# Patient Record
Sex: Male | Born: 1960 | Race: White | Hispanic: No | Marital: Married | Smoking: Former smoker
Health system: Southern US, Community
[De-identification: ages and names within clinical notes are randomized; demographics above are authoritative.]

## PROBLEM LIST (undated history)

## (undated) DIAGNOSIS — G43909 Migraine, unspecified, not intractable, without status migrainosus: Secondary | ICD-10-CM

## (undated) DIAGNOSIS — M25512 Pain in left shoulder: Secondary | ICD-10-CM

## (undated) DIAGNOSIS — I8390 Asymptomatic varicose veins of unspecified lower extremity: Secondary | ICD-10-CM

## (undated) HISTORY — DX: Migraine, unspecified, not intractable, without status migrainosus: G43.909

## (undated) HISTORY — PX: VASECTOMY: SHX75

## (undated) HISTORY — PX: WISDOM TOOTH EXTRACTION: SHX21

## (undated) HISTORY — DX: Asymptomatic varicose veins of unspecified lower extremity: I83.90

## (undated) HISTORY — DX: Pain in left shoulder: M25.512

## (undated) HISTORY — PX: ROTATOR CUFF REPAIR: SHX139

---

## 1998-05-31 ENCOUNTER — Encounter: Admission: RE | Admit: 1998-05-31 | Discharge: 1998-05-31 | Payer: Self-pay | Admitting: *Deleted

## 1998-07-12 ENCOUNTER — Ambulatory Visit (HOSPITAL_COMMUNITY): Admission: RE | Admit: 1998-07-12 | Discharge: 1998-07-12 | Payer: Self-pay | Admitting: Urology

## 1998-07-12 ENCOUNTER — Other Ambulatory Visit: Admission: RE | Admit: 1998-07-12 | Discharge: 1998-07-12 | Payer: Self-pay | Admitting: Urology

## 1998-10-05 ENCOUNTER — Emergency Department (HOSPITAL_COMMUNITY): Admission: EM | Admit: 1998-10-05 | Discharge: 1998-10-05 | Payer: Self-pay | Admitting: Emergency Medicine

## 2001-11-19 ENCOUNTER — Encounter: Payer: Self-pay | Admitting: Internal Medicine

## 2001-11-19 ENCOUNTER — Encounter: Admission: RE | Admit: 2001-11-19 | Discharge: 2001-11-19 | Payer: Self-pay | Admitting: Internal Medicine

## 2005-08-28 ENCOUNTER — Ambulatory Visit: Payer: Self-pay | Admitting: Internal Medicine

## 2005-09-18 ENCOUNTER — Ambulatory Visit: Payer: Self-pay | Admitting: Internal Medicine

## 2005-10-04 ENCOUNTER — Emergency Department (HOSPITAL_COMMUNITY): Admission: EM | Admit: 2005-10-04 | Discharge: 2005-10-04 | Payer: Self-pay | Admitting: Emergency Medicine

## 2005-10-18 ENCOUNTER — Ambulatory Visit: Payer: Self-pay | Admitting: Internal Medicine

## 2006-01-21 ENCOUNTER — Ambulatory Visit: Payer: Self-pay | Admitting: Internal Medicine

## 2007-01-16 ENCOUNTER — Encounter: Payer: Self-pay | Admitting: Internal Medicine

## 2007-01-23 ENCOUNTER — Encounter: Admission: RE | Admit: 2007-01-23 | Discharge: 2007-04-23 | Payer: Self-pay | Admitting: Specialist

## 2011-01-23 ENCOUNTER — Encounter: Payer: Self-pay | Admitting: Internal Medicine

## 2011-01-23 ENCOUNTER — Encounter (INDEPENDENT_AMBULATORY_CARE_PROVIDER_SITE_OTHER): Payer: BC Managed Care – PPO | Admitting: Internal Medicine

## 2011-01-23 ENCOUNTER — Encounter (INDEPENDENT_AMBULATORY_CARE_PROVIDER_SITE_OTHER): Payer: Self-pay | Admitting: *Deleted

## 2011-01-23 DIAGNOSIS — Z0000 Encounter for general adult medical examination without abnormal findings: Secondary | ICD-10-CM

## 2011-01-23 DIAGNOSIS — R51 Headache: Secondary | ICD-10-CM

## 2011-01-23 DIAGNOSIS — R519 Headache, unspecified: Secondary | ICD-10-CM | POA: Insufficient documentation

## 2011-01-23 DIAGNOSIS — Z1211 Encounter for screening for malignant neoplasm of colon: Secondary | ICD-10-CM

## 2011-01-23 DIAGNOSIS — Z23 Encounter for immunization: Secondary | ICD-10-CM

## 2011-01-30 ENCOUNTER — Other Ambulatory Visit (INDEPENDENT_AMBULATORY_CARE_PROVIDER_SITE_OTHER): Payer: BC Managed Care – PPO

## 2011-01-30 DIAGNOSIS — I10 Essential (primary) hypertension: Secondary | ICD-10-CM

## 2011-01-30 DIAGNOSIS — R51 Headache: Secondary | ICD-10-CM

## 2011-01-30 DIAGNOSIS — Z0000 Encounter for general adult medical examination without abnormal findings: Secondary | ICD-10-CM

## 2011-01-30 DIAGNOSIS — Z1211 Encounter for screening for malignant neoplasm of colon: Secondary | ICD-10-CM

## 2011-01-30 LAB — PSA: PSA: 0.32 ng/mL (ref 0.10–4.00)

## 2011-01-30 LAB — BASIC METABOLIC PANEL
CO2: 29 mEq/L (ref 19–32)
Calcium: 9.1 mg/dL (ref 8.4–10.5)
Creatinine, Ser: 1 mg/dL (ref 0.4–1.5)

## 2011-01-30 LAB — CBC WITH DIFFERENTIAL/PLATELET
Basophils Absolute: 0 10*3/uL (ref 0.0–0.1)
Basophils Relative: 0.4 % (ref 0.0–3.0)
Eosinophils Absolute: 0.5 10*3/uL (ref 0.0–0.7)
Lymphocytes Relative: 23 % (ref 12.0–46.0)
MCHC: 34.8 g/dL (ref 30.0–36.0)
Neutrophils Relative %: 62.8 % (ref 43.0–77.0)
Platelets: 228 10*3/uL (ref 150.0–400.0)
RBC: 4.88 Mil/uL (ref 4.22–5.81)

## 2011-01-30 LAB — HEPATIC FUNCTION PANEL
ALT: 24 U/L (ref 0–53)
AST: 22 U/L (ref 0–37)
Albumin: 4.1 g/dL (ref 3.5–5.2)
Alkaline Phosphatase: 62 U/L (ref 39–117)
Bilirubin, Direct: 0.1 mg/dL (ref 0.0–0.3)
Total Bilirubin: 0.6 mg/dL (ref 0.3–1.2)
Total Protein: 6.4 g/dL (ref 6.0–8.3)

## 2011-01-30 LAB — TSH: TSH: 1.06 u[IU]/mL (ref 0.35–5.50)

## 2011-01-30 LAB — LIPID PANEL
Cholesterol: 124 mg/dL (ref 0–200)
HDL: 39.8 mg/dL (ref >39.00)
LDL Cholesterol: 72 mg/dL (ref 0–99)
Total CHOL/HDL Ratio: 3
Triglycerides: 59 mg/dL (ref 0.0–149.0)
VLDL: 11.8 mg/dL (ref 0.0–40.0)

## 2011-01-30 NOTE — Progress Notes (Signed)
Addended by: Floydene Flock on: 01/30/2011 12:31 PM   Modules accepted: Orders

## 2011-01-30 NOTE — Letter (Signed)
Summary: Pre Visit Letter Revised  [facility]  [address] [place], [place] [address]   Phone: [phone]  Fax: [phone]        01/23/2011 MRN: [id number] [facility] [address] [place], [place]  [address]  [place]             Procedure Date:  02-28-11           Direct Colon--Dr. [name]   Welcome to the Gastroenterology Division at [facility].    You are scheduled to see a nurse for your pre-procedure visit on 02-14-11 at 8:30a.m. on the 3rd floor at [facility], [address]  We ask that you try to arrive at our office 15 minutes prior to your appointment time to allow for check-in.  Please take a minute to review the attached form.  If you answer "Yes" to one or more of the questions on the first page, we ask that you call the person listed at your earliest opportunity.  If you answer "No" to all of the questions, please complete the rest of the form and bring it to your appointment.    Your nurse visit will consist of discussing your medical and surgical history, your immediate family medical history, and your medications.   If you are unable to list all of your medications on the form, please bring the medication bottles to your appointment and we will list them.  We will need to be aware of both prescribed and over the counter drugs.  We will need to know exact dosage information as well.    Please be prepared to read and sign documents such as consent forms, a financial agreement, and acknowledgement forms.  If necessary, and with your consent, a friend or relative is welcome to sit-in on the nurse visit with you.  Please bring your insurance card so that we may make a copy of it.  If your insurance requires a referral to see a specialist, please bring your referral form from your primary care physician.  No co-pay is required for this nurse visit.     If you cannot keep your appointment, please call [phone] to cancel or  reschedule prior to your appointment date.  This allows [place] the opportunity to schedule an appointment for another patient in need of care.    Thank you for choosing [facility] for your medical needs.  We appreciate the opportunity to care for you.  Please visit [place] at our website  to learn more about our practice.  Sincerely, The Gastroenterology Division

## 2011-01-30 NOTE — Assessment & Plan Note (Signed)
Summary: CPX/KN/PH   Vital Signs:  Patient profile:   50 year old male Height:      72.50 inches Weight:      250 pounds BMI:     33.56 Pulse rate:   68 / minute Resp:     13 per minute BP sitting:   120 / 70  (left arm)  Vitals Entered By: Doristine Devoid CMA (January 23, 2011 12:47 PM)  CC: CPX not fasting , General Medical Evaluation   CC:  CPX not fasting  and General Medical Evaluation.  History of Present Illness:    [facility]  is here  for a physical; he is asymptomatic.   Preventive Screening-Counseling & Management  Alcohol-Tobacco     Smoking Status: quit  Caffeine-Diet-Exercise     Does Patient Exercise: yes  Current Medications (verified): 1)  None  Allergies (verified): No Known Drug Allergies  Past History:  Past Medical History: Varicose Veins Headache, migraines Hematuria, negative Urologic  evaluation 2002/04/18, Dr Aldean Ast  Past Surgical History: Rotator cuff repair 1978 Vasectomy  Family History: Father: headaches,HTN, prostate disease , lung cancer, CVA (died 2010-04-18) Mother: depression,HTN Siblings: negative PGM: CVA; MGM : lung cancer   Social History: Occupation:Senior Art gallery manager Married Former Smoker: quit 04/18/86 Alcohol use-yes:rarely Regular exercise-yes; > 2X/week Smoking Status:  quit Does Patient Exercise:  yes  Review of Systems  The patient denies anorexia, fever, weight loss, weight gain, vision loss, decreased hearing, hoarseness, chest pain, syncope, dyspnea on exertion, peripheral edema, prolonged cough, hemoptysis, abdominal pain, melena, hematochezia, severe indigestion/heartburn, hematuria, suspicious skin lesions, depression, unusual weight change, abnormal bleeding, enlarged lymph nodes, and angioedema.    Physical Exam  General:  well-nourished;alert,appropriate and cooperative throughout examination Head:  Normocephalic and atraumatic without obvious abnormalities. Pattern  alopecia  Eyes:  No corneal or  conjunctival inflammation noted. Marland Kitchen Perrla. Funduscopic exam benign, without hemorrhages, exudates or papilledema.  Ears:  External ear exam shows no significant lesions or deformities.  Otoscopic examination reveals clear canals, tympanic membranes are intact bilaterally without bulging, retraction, inflammation or discharge. Hearing is grossly normal bilaterally. Nose:  External nasal examination shows no deformity or inflammation. Nasal mucosa are pink and moist without lesions or exudates. Dislocated & deviated septum Mouth:  Oral mucosa and oropharynx without lesions or exudates.  Teeth in good repair. Neck:  No deformities, masses, or tenderness noted. Lungs:  Normal respiratory effort, chest expands symmetrically. Lungs are clear to auscultation, no crackles or wheezes. Heart:  Normal rate and regular rhythm. S1 and S2 normal without gallop, murmur, click, rub .S4 Abdomen:  Bowel sounds positive,abdomen soft and non-tender without masses, organomegaly or hernias noted. Rectal:  No external abnormalities noted. Normal sphincter tone. No rectal masses or tenderness. Genitalia:  Testes bilaterally descended without nodularity, tenderness or masses. No scrotal masses or lesions. No penis lesions or urethral discharge. L varicocele.   Prostate:  Prostate gland firm and smooth, ULN size w/o  enlargement, nodularity, tenderness, mass, asymmetry or induration. Msk:  No deformity or scoliosis noted of thoracic or lumbar spine.   Pulses:  R and L carotid,radial,dorsalis pedis and posterior tibial pulses are full and equal bilaterally Extremities:  No clubbing, cyanosis, edema, or deformity noted with normal full range of motion of all joints.   Neurologic:  alert & oriented X3 and DTRs symmetrical and normal.   Skin:  Intact without suspicious lesions or rashes Cervical Nodes:  No lymphadenopathy noted Axillary Nodes:  No palpable lymphadenopathy Inguinal Nodes:  No  significant adenopathy Psych:   memory intact for recent and remote, normally interactive, and good eye contact.     Impression & Recommendations:  Problem # 1:  ROUTINE GENERAL MEDICAL EXAM@HEALTH  CARE FACL (ICD-V70.0)  Orders: EKG w/ Interpretation (93000) Gastroenterology Referral (GI)  Problem # 2:  SCREENING, COLON CANCER (ICD-V76.51)  Orders: Gastroenterology Referral (GI)  Problem # 3:  HEADACHE (ICD-784.0) PMH of migraines  Other Orders: Tdap => 37yrs IM (04540) Admin 1st Vaccine (98119)  Patient Instructions: 1)  Consider fasting labs: 2)  BMP; 3)  Hepatic Panel ; 4)  Lipid Panel ; 5)  TSH ; 6)  CBC w/ Diff ; 7)  PSA .   Orders Added: 1)  Tdap => 92yrs IM [90715] 2)  Admin 1st Vaccine [90471] 3)  Est. Patient 40-64 years [99396] 4)  EKG w/ Interpretation [93000] 5)  Gastroenterology Referral [GI]   Immunizations Administered:  Tetanus Vaccine:    Vaccine Type: Tdap    Site: right deltoid    Mfr: GlaxoSmithKline    Dose: 0.5 ml    Route: IM    Given by: Doristine Devoid CMA    Exp. Date: 10/05/2012    Lot #: [id number]   Immunizations Administered:  Tetanus Vaccine:    Vaccine Type: Tdap    Site: right deltoid    Mfr: GlaxoSmithKline    Dose: 0.5 ml    Route: IM    Given by: Doristine Devoid CMA    Exp. Date: 10/05/2012    Lot #: [id number]

## 2011-02-08 NOTE — Letter (Signed)
Summary: [facility]   Imported By: Maryln Gottron 01/29/2011 13:09:26  _____________________________________________________________________  External Attachment:    Type:   Image     Comment:   External Document

## 2011-02-14 ENCOUNTER — Ambulatory Visit (AMBULATORY_SURGERY_CENTER): Payer: BC Managed Care – PPO | Admitting: *Deleted

## 2011-02-14 VITALS — Ht 73.0 in | Wt 251.0 lb

## 2011-02-14 DIAGNOSIS — Z1211 Encounter for screening for malignant neoplasm of colon: Secondary | ICD-10-CM

## 2011-02-14 MED ORDER — PEG-KCL-NACL-NASULF-NA ASC-C 100 G PO SOLR: ORAL | Status: DC

## 2011-02-27 ENCOUNTER — Encounter: Payer: Self-pay | Admitting: Internal Medicine

## 2011-02-28 ENCOUNTER — Encounter: Payer: Self-pay | Admitting: Internal Medicine

## 2011-02-28 ENCOUNTER — Ambulatory Visit (AMBULATORY_SURGERY_CENTER): Payer: BC Managed Care – PPO | Admitting: Internal Medicine

## 2011-02-28 VITALS — BP 124/57 | HR 64 | Temp 97.1°F | Resp 17 | Ht 73.0 in | Wt 245.0 lb

## 2011-02-28 DIAGNOSIS — K5730 Diverticulosis of large intestine without perforation or abscess without bleeding: Secondary | ICD-10-CM

## 2011-02-28 DIAGNOSIS — Z1211 Encounter for screening for malignant neoplasm of colon: Secondary | ICD-10-CM

## 2011-02-28 MED ORDER — SODIUM CHLORIDE 0.9 % IV SOLN: 500.0000 mL | INTRAVENOUS | Status: DC

## 2011-02-28 NOTE — Patient Instructions (Signed)
HANDOUTS GIVEN ON DIVERTICULOSIS AND HIGH FIBER DIET REPEAT EXAM IN 10 YEARS-----WE WILL SEND YOU A LETTER REMINDING YOU OF THIS

## 2011-03-01 ENCOUNTER — Telehealth: Payer: Self-pay

## 2011-03-01 NOTE — Telephone Encounter (Signed)

## 2011-05-13 HISTORY — PX: COLONOSCOPY: SHX174

## 2011-06-27 NOTE — Progress Notes (Signed)
Addended by: Sarina Ill ANN on: 06/27/2011 03:13 PM   Modules accepted: Level of Service

## 2011-06-27 NOTE — Progress Notes (Signed)
Addended by: Sarina Ill ANN on: 06/27/2011 03:34 PM   Modules accepted: Orders

## 2011-10-02 ENCOUNTER — Ambulatory Visit (INDEPENDENT_AMBULATORY_CARE_PROVIDER_SITE_OTHER): Payer: BC Managed Care – PPO

## 2011-10-02 DIAGNOSIS — Z23 Encounter for immunization: Secondary | ICD-10-CM

## 2011-12-19 ENCOUNTER — Other Ambulatory Visit: Payer: Self-pay | Admitting: Internal Medicine

## 2011-12-19 ENCOUNTER — Ambulatory Visit (HOSPITAL_COMMUNITY)
Admission: RE | Admit: 2011-12-19 | Discharge: 2011-12-19 | Disposition: A | Discharge status: Home / Self Care | Payer: BC Managed Care – PPO | Source: Ambulatory Visit | Attending: Internal Medicine | Admitting: Internal Medicine

## 2011-12-19 ENCOUNTER — Encounter: Payer: Self-pay | Admitting: Internal Medicine

## 2011-12-19 ENCOUNTER — Encounter (HOSPITAL_COMMUNITY): Payer: Self-pay | Admitting: *Deleted

## 2011-12-19 ENCOUNTER — Encounter (HOSPITAL_COMMUNITY)
Admission: RE | Disposition: A | Discharge status: Home / Self Care | Payer: Self-pay | Source: Ambulatory Visit | Attending: Internal Medicine

## 2011-12-19 ENCOUNTER — Ambulatory Visit (INDEPENDENT_AMBULATORY_CARE_PROVIDER_SITE_OTHER): Payer: BC Managed Care – PPO | Admitting: Internal Medicine

## 2011-12-19 VITALS — BP 130/82 | HR 64 | Temp 99.0°F | Wt 248.8 lb

## 2011-12-19 DIAGNOSIS — K222 Esophageal obstruction: Secondary | ICD-10-CM | POA: Insufficient documentation

## 2011-12-19 DIAGNOSIS — K200 Eosinophilic esophagitis: Secondary | ICD-10-CM

## 2011-12-19 DIAGNOSIS — T18128A Food in esophagus causing other injury, initial encounter: Secondary | ICD-10-CM

## 2011-12-19 DIAGNOSIS — T18108A Unspecified foreign body in esophagus causing other injury, initial encounter: Secondary | ICD-10-CM

## 2011-12-19 DIAGNOSIS — IMO0002 Reserved for concepts with insufficient information to code with codable children: Secondary | ICD-10-CM | POA: Insufficient documentation

## 2011-12-19 DIAGNOSIS — W44F3XA Food entering into or through a natural orifice, initial encounter: Secondary | ICD-10-CM | POA: Diagnosis present

## 2011-12-19 DIAGNOSIS — R1310 Dysphagia, unspecified: Secondary | ICD-10-CM | POA: Insufficient documentation

## 2011-12-19 DIAGNOSIS — K209 Esophagitis, unspecified without bleeding: Secondary | ICD-10-CM | POA: Insufficient documentation

## 2011-12-19 DIAGNOSIS — R1115 Cyclical vomiting syndrome unrelated to migraine: Secondary | ICD-10-CM

## 2011-12-19 HISTORY — PX: ESOPHAGOGASTRODUODENOSCOPY: SHX5428

## 2011-12-19 SURGERY — EGD (ESOPHAGOGASTRODUODENOSCOPY)
Anesthesia: Moderate Sedation

## 2011-12-19 MED ORDER — FENTANYL CITRATE 0.05 MG/ML IJ SOLN
INTRAMUSCULAR | Status: AC
  Filled 2011-12-19: qty 2

## 2011-12-19 MED ORDER — PANTOPRAZOLE SODIUM 40 MG PO TBEC: 40.0000 mg | DELAYED_RELEASE_TABLET | Freq: Every day | ORAL | Status: DC

## 2011-12-19 MED ORDER — MIDAZOLAM HCL 10 MG/2ML IJ SOLN
INTRAMUSCULAR | Status: DC | PRN
  Administered 2011-12-19 (×2): 2 mg via INTRAVENOUS
  Administered 2011-12-19: 1 mg via INTRAVENOUS
  Administered 2011-12-19: 2 mg via INTRAVENOUS
  Administered 2011-12-19: 1 mg via INTRAVENOUS

## 2011-12-19 MED ORDER — BUTAMBEN-TETRACAINE-BENZOCAINE 2-2-14 % EX AERO
INHALATION_SPRAY | CUTANEOUS | Status: DC | PRN
  Administered 2011-12-19: 2 via TOPICAL

## 2011-12-19 MED ORDER — MIDAZOLAM HCL 10 MG/2ML IJ SOLN
INTRAMUSCULAR | Status: AC
  Filled 2011-12-19: qty 2

## 2011-12-19 MED ORDER — FENTANYL NICU IV SYRINGE 50 MCG/ML
INJECTION | INTRAMUSCULAR | Status: DC | PRN
  Administered 2011-12-19 (×3): 25 ug via INTRAVENOUS

## 2011-12-19 MED ORDER — SODIUM CHLORIDE 0.9 % IV SOLN
INTRAVENOUS | Status: DC
  Administered 2011-12-19: 500 mL via INTRAVENOUS

## 2011-12-19 NOTE — Op Note (Signed)
[facility] [address] [place], [place]  [address]  ENDOSCOPY PROCEDURE REPORT  PATIENT:  Douglas Ferguson, Douglas Ferguson  MR#:  [id number] BIRTHDATE:  Nov 21, 1960, 50 yrs. old  GENDER:  male ENDOSCOPIST:  Carie Caddy. Omero Kowal, MD Referred by:  Marga Melnick, M.D. PROCEDURE DATE:  12/19/2011 PROCEDURE:  EGD with biopsy, 43239, EGD with foreign body removal ASA CLASS:  Class I INDICATIONS:  dysphagia, food impaction MEDICATIONS:   Fentanyl 75 mcg IV, Versed 8 mg IV TOPICAL ANESTHETIC:  Cetacaine Spray  DESCRIPTION OF PROCEDURE:   After the risks benefits and alternatives of the procedure were thoroughly explained, informed consent was obtained.  The Pentax Gastroscope [id number] endoscope was introduced through the mouth and advanced to the second portion of the duodenum, without limitations.  The instrument was slowly withdrawn as the mucosa was fully examined. <<PROCEDUREIMAGES>>  Esophagitis, most consistent with eosinophilic esophagitis, was found in the total esophagus.  This was characterized by rings and linear furrows. Multiple biopsies were obtained and sent to pathology.  A meat bolus was found at the gastroesophageal junction. The foreign body was removed with roth net and by advancing the remaining bolus into the stomach.  An esophageal ring was found at the gastroesophageal junction, along with pressure inflammation and friability.  The stomach was entered and closely examined. The antrum, angularis, and lesser curvature were well visualized, including a retroflexed view of the cardia and fundus. The stomach wall was normally distensable. The scope passed easily through the pylorus into the duodenum.  Biopsies were obtained from the gastric body and antrum to evaluate for H. Pylori.  Mild duodenitis was found in the bulb of the duodenum. Normal otherwise in the examined portion of the second part of the duodenum.    Retroflexed views revealed no abnormalities.    The scope  was then withdrawn from the patient and the procedure completed.  COMPLICATIONS:  None  ENDOSCOPIC IMPRESSION: 1) Esophagitis in the total esophagus.  Suspicious for eosinophilic esophagitis.  Multiple biopsies performed. 2) Meat bolus at the gastroesophageal junction.  Removed. 3) Ring at the gastroesophageal junction with friability likely secondary to pressure injury. 4) Normal stomach.  Biopsies taken to exclude H. Pylori. 5) Duodenitis in the bulb of duodenum 6) Normal in the second portion duodenum  RECOMMENDATIONS: 1) Await pathology results 2) Begin daily pantoprazole 40 mg daily. This should be taken 30 minutes to 1 hour before the 1st meal of the day. 3) Office follow-up with Dr. [name] 4) Likely repeat EGD for dilation.  Carie Caddy. Rhea Belton, MD  CC:  Pecola Lawless, MD Yancey Flemings, MD The Patient  n. eSIGNED:   Carie Caddy. Laterra Lubinski at 12/19/2011 04:04 PM  [name], [phone]

## 2011-12-19 NOTE — Progress Notes (Signed)
  Subjective:    Patient ID: Douglas Ferguson, male    DOB: June 27, 1961, 51 y.o.   MRN: [id number]  HPI Last night  while eating chicken, vegetables and corn he noted acute dysphagia. He regurgitated some of it but continued to have a sensation of something wedged in his esophagus. He would have  repeated emesis with  watery discharge & apparent fragments of chicken  since. He's been unable to eat or even drink water since onset of symptoms. He had a similar issue on at least 2 occasions 3 years ago but this resolved w/o treatment. He is on no medications other than occasional nonsteroidal for chronic shoulder pain. He denies a history of GERD; he has  never had an upper endoscopy.  Colonoscopy in July 2012 was negative by [facility]  gastroenterology  Family history is negative for any gastrointestinal disease      Review of Systems He denies abdominal pain, unexplained weight loss, melena, or rectal bleeding.     Objective:   Physical Exam General appearance is one of good health and nourishment w/o distress , but he is uncomfortable.  Eyes: No conjunctival inflammation or scleral icterus is present.  Oral exam: Dental hygiene is good; lips and gums are healthy appearing.There is mild - moderate  oropharyngeal erythema or exudate noted.   Heart:  Normal rate and regular rhythm. S1 and S2 normal without gallop, murmur, click, rub or other extra sounds     Lungs:Chest clear to auscultation; no wheezes, rhonchi,rales ,or rubs present.No increased work of breathing.   Abdomen: bowel sounds decreased, soft and non-tender without masses, organomegaly or hernias noted.  No guarding or rebound   Skin:Warm & dry.  Intact without suspicious lesions or rashes ; no jaundice or tenting  Lymphatic: No lymphadenopathy is noted about the head, neck, axilla             Assessment & Plan:  #1 acute dysphagia; the most likely process at this time is severe esophageal spasm. He could possibly  have residual food impaction.  Past history he suggest that this has been an intermittent problem. He most likely has subclinical reflux which could be exacerbated by the nonsteroidals taken for the shoulder pain.

## 2011-12-19 NOTE — Interval H&P Note (Signed)
History and Physical Interval Note:  12/19/2011 3:24 PM  Douglas Ferguson  has presented today for surgery, with the diagnosis of foreign body.  The various methods of treatment have been discussed with the patient and family. After consideration of risks, benefits and other options for treatment, the patient has consented to  Procedure(s): ESOPHAGOGASTRODUODENOSCOPY (EGD) as a surgical intervention .  The patients' history has been reviewed, patient examined, no change in status, stable for surgery.  I have reviewed the patients' chart and labs.  Questions were answered to the patient's satisfaction.     [name] M

## 2011-12-19 NOTE — Patient Instructions (Addendum)
The triggers for reflux  include stress; the "aspirin family" ; alcohol; peppermint; and caffeine (coffee, tea, cola, and chocolate). The aspirin family would include aspirin and the nonsteroidal agents such as ibuprofen &  Naproxen. Tylenol would not cause reflux. If having reflux ; food & drink should be avoided for @ least 2 hours before going to bed.   Long-term will want to use tramadol for the shoulder pain instead of the nonsteroidal agents.  I discussed your case with the gastroenterologist on call. You're to go to [facility] outpatient registration for admission to the endoscopy suite. An upper endoscopy will be necessary to remove any impaction.

## 2011-12-19 NOTE — H&P (View-Only) (Signed)
  Subjective:    Patient ID: Douglas Ferguson, male    DOB: 03/25/1961, 51 y.o.   MRN: [id number]  HPI Last night  while eating chicken, vegetables and corn he noted acute dysphagia. He regurgitated some of it but continued to have a sensation of something wedged in his esophagus. He would have  repeated emesis with  watery discharge & apparent fragments of chicken  since. He's been unable to eat or even drink water since onset of symptoms. He had a similar issue on at least 2 occasions 3 years ago but this resolved w/o treatment. He is on no medications other than occasional nonsteroidal for chronic shoulder pain. He denies a history of GERD; he has  never had an upper endoscopy.  Colonoscopy in July 2012 was negative by [facility]  gastroenterology  Family history is negative for any gastrointestinal disease      Review of Systems He denies abdominal pain, unexplained weight loss, melena, or rectal bleeding.     Objective:   Physical Exam General appearance is one of good health and nourishment w/o distress , but he is uncomfortable.  Eyes: No conjunctival inflammation or scleral icterus is present.  Oral exam: Dental hygiene is good; lips and gums are healthy appearing.There is mild - moderate  oropharyngeal erythema or exudate noted.   Heart:  Normal rate and regular rhythm. S1 and S2 normal without gallop, murmur, click, rub or other extra sounds     Lungs:Chest clear to auscultation; no wheezes, rhonchi,rales ,or rubs present.No increased work of breathing.   Abdomen: bowel sounds decreased, soft and non-tender without masses, organomegaly or hernias noted.  No guarding or rebound   Skin:Warm & dry.  Intact without suspicious lesions or rashes ; no jaundice or tenting  Lymphatic: No lymphadenopathy is noted about the head, neck, axilla             Assessment & Plan:  #1 acute dysphagia; the most likely process at this time is severe esophageal spasm. He could possibly  have residual food impaction.  Past history he suggest that this has been an intermittent problem. He most likely has subclinical reflux which could be exacerbated by the nonsteroidals taken for the shoulder pain. 

## 2011-12-20 ENCOUNTER — Encounter: Payer: Self-pay | Admitting: Internal Medicine

## 2011-12-20 ENCOUNTER — Encounter (HOSPITAL_COMMUNITY): Payer: Self-pay | Admitting: Internal Medicine

## 2011-12-22 ENCOUNTER — Encounter: Payer: Self-pay | Admitting: Internal Medicine

## 2011-12-27 ENCOUNTER — Telehealth: Payer: Self-pay | Admitting: *Deleted

## 2011-12-27 NOTE — Telephone Encounter (Signed)
Mailed pt an appointment note with Dr [name], the letter and path report to pt per Dr Rhea Belton.

## 2011-12-27 NOTE — Telephone Encounter (Signed)
Letter from: Beverley Fiedler Reason for Letter: Results Review Please mail this path letter. Procedure done recently at [facility] [place] [facility].  He needs followup with Yancey Flemings.  THanks

## 2012-01-08 ENCOUNTER — Encounter: Payer: Self-pay | Admitting: Internal Medicine

## 2012-01-08 ENCOUNTER — Ambulatory Visit: Payer: BC Managed Care – PPO | Admitting: Internal Medicine

## 2012-01-08 ENCOUNTER — Ambulatory Visit (INDEPENDENT_AMBULATORY_CARE_PROVIDER_SITE_OTHER): Payer: BC Managed Care – PPO | Admitting: Internal Medicine

## 2012-01-08 VITALS — BP 124/68 | HR 60 | Ht 73.0 in | Wt 253.6 lb

## 2012-01-08 DIAGNOSIS — K5730 Diverticulosis of large intestine without perforation or abscess without bleeding: Secondary | ICD-10-CM

## 2012-01-08 DIAGNOSIS — T18108A Unspecified foreign body in esophagus causing other injury, initial encounter: Secondary | ICD-10-CM

## 2012-01-08 DIAGNOSIS — K222 Esophageal obstruction: Secondary | ICD-10-CM

## 2012-01-08 DIAGNOSIS — K200 Eosinophilic esophagitis: Secondary | ICD-10-CM

## 2012-01-08 NOTE — Patient Instructions (Signed)
Please call the office at [phone] to schedule your procedure.

## 2012-01-08 NOTE — Progress Notes (Signed)
HISTORY OF PRESENT ILLNESS:  Douglas Ferguson is a 51 y.o. male who I saw in April of 2012 for routine screening colonoscopy. The examination revealed moderate sigmoid diverticulosis, but was otherwise normal. Followup in 10 years recommended. The patient was in his usual state of health until 12/18/2011 when he developed acute food impaction after consuming chicken and vegetables. He has had intermittent solid food dysphagia over the years, but transient food impactions have passed. This did not, and he sought medical attention on 12/19/2011 culminating with upper endoscopy in removal of food impaction by Dr. Rhea Belton on that day. I have reviewed the procedure report. The patient had changes of eosinophilic esophagitis. This was supported by biopsies. He also had duodenitis with negative biopsies for H. Pylori. He was placed on pantoprazole and asked to followup with me today. The patient recently discontinued this medication, feeling the need for its use. He denies a history of reflux symptoms. No problems with swallowing since his endoscopy. No dilation performed at the time of endoscopy. GI review of systems is otherwise negative.  REVIEW OF SYSTEMS:  All non-GI ROS negative except for occasional headaches  Past Medical History  Diagnosis Date  . Shoulder pain, left     prn NSAIDS  . Arthritis   . Varicose veins   . Migraines     Past Surgical History  Procedure Date  . Rotator cuff repair     left  . Vasectomy   . Wisdom tooth extraction   . Colonoscopy 05/2011    negative; [facility]  . Esophagogastroduodenoscopy 12/19/2011    Procedure: ESOPHAGOGASTRODUODENOSCOPY (EGD);  Surgeon: Erick Blinks, MD;  Location: [facility];  Service: Gastroenterology;  Laterality: N/A;    Social History Laderrick Wilk  reports that he quit smoking about 27 years ago. He has never used smokeless tobacco. He reports that he drinks alcohol. He reports that he does not use illicit drugs.  family history  includes Hypertension in his father and mother and Lung cancer in his father.  No Known Allergies     PHYSICAL EXAMINATION:  Vital signs: BP 124/68  Pulse 60  Ht 6\' 1"  (1.854 m)  Wt 253 lb 9.6 oz (115.032 kg)  BMI 33.46 kg/m2 General: Well-developed, well-nourished, no acute distress Abdomen: Not examined Psychiatric: alert and oriented x3. Cooperative    ASSESSMENT:  #1. Recent food impaction secondary to esophageal strictures. #2. Endoscopic and histologic changes consistent with eosinophilic esophagitis #3. Negative screening colonoscopy, save diverticulosis, April 2012   PLAN:  #1. I discussed today with the patient with his known about eosinophilic esophagitis. We discussed. As to the etiology as well as different treatment strategies it had been evaluated, to date. As well, the importance of addressing esophageal stricturing in a patient with a significant food impaction. To this end, I recommended the following... Resumed PPI therapy daily as this has been implicated in etiology of the condition in some patients. As well, upper endoscopy with esophageal dilation.The nature of the procedure, as well as the risks, benefits, and alternatives were carefully and thoroughly reviewed with the patient. Ample time for discussion and questions allowed. The patient understood, was satisfied, and agreed to proceed. He will consult his schedule, and contact the office to arrange the examination.

## 2012-12-27 ENCOUNTER — Other Ambulatory Visit: Payer: Self-pay

## 2013-01-17 ENCOUNTER — Inpatient Hospital Stay (HOSPITAL_COMMUNITY): Payer: BC Managed Care – PPO | Admitting: *Deleted

## 2013-01-17 ENCOUNTER — Encounter (HOSPITAL_COMMUNITY): Payer: Self-pay

## 2013-01-17 ENCOUNTER — Inpatient Hospital Stay (HOSPITAL_COMMUNITY): Payer: BC Managed Care – PPO

## 2013-01-17 ENCOUNTER — Emergency Department (HOSPITAL_COMMUNITY): Payer: BC Managed Care – PPO

## 2013-01-17 ENCOUNTER — Encounter (HOSPITAL_COMMUNITY): Payer: Self-pay | Admitting: *Deleted

## 2013-01-17 ENCOUNTER — Encounter (HOSPITAL_COMMUNITY)
Admission: EM | Disposition: A | Discharge status: Home Health | Payer: Self-pay | Source: Home / Self Care | Attending: Orthopedic Surgery

## 2013-01-17 ENCOUNTER — Inpatient Hospital Stay (HOSPITAL_COMMUNITY)
Admission: EM | Admit: 2013-01-17 | Discharge: 2013-01-20 | DRG: 211 | Disposition: A | Discharge status: Home Health | Payer: BC Managed Care – PPO | Attending: Orthopedic Surgery | Admitting: Orthopedic Surgery

## 2013-01-17 DIAGNOSIS — Y929 Unspecified place or not applicable: Secondary | ICD-10-CM

## 2013-01-17 DIAGNOSIS — Z791 Long term (current) use of non-steroidal anti-inflammatories (NSAID): Secondary | ICD-10-CM

## 2013-01-17 DIAGNOSIS — S72101A Unspecified trochanteric fracture of right femur, initial encounter for closed fracture: Secondary | ICD-10-CM

## 2013-01-17 DIAGNOSIS — S72143A Displaced intertrochanteric fracture of unspecified femur, initial encounter for closed fracture: Principal | ICD-10-CM | POA: Diagnosis present

## 2013-01-17 DIAGNOSIS — W010XXA Fall on same level from slipping, tripping and stumbling without subsequent striking against object, initial encounter: Secondary | ICD-10-CM | POA: Diagnosis present

## 2013-01-17 DIAGNOSIS — Z6833 Body mass index (BMI) 33.0-33.9, adult: Secondary | ICD-10-CM

## 2013-01-17 DIAGNOSIS — Z87891 Personal history of nicotine dependence: Secondary | ICD-10-CM

## 2013-01-17 DIAGNOSIS — S72109A Unspecified trochanteric fracture of unspecified femur, initial encounter for closed fracture: Secondary | ICD-10-CM

## 2013-01-17 HISTORY — PX: INTRAMEDULLARY (IM) NAIL INTERTROCHANTERIC: SHX5875

## 2013-01-17 LAB — BASIC METABOLIC PANEL
Calcium: 9.2 mg/dL (ref 8.4–10.5)
GFR calc Af Amer: >90 mL/min (ref >90)
GFR calc non Af Amer: >90 mL/min (ref >90)
Glucose, Bld: 121 mg/dL — ABNORMAL HIGH (ref 70–99)
Potassium: 3.6 mEq/L (ref 3.5–5.1)
Sodium: 137 mEq/L (ref 135–145)

## 2013-01-17 LAB — CBC
Hemoglobin: 15.9 g/dL (ref 13.0–17.0)
MCH: 30.5 pg (ref 26.0–34.0)
MCHC: 35.3 g/dL (ref 30.0–36.0)
Platelets: 250 10*3/uL (ref 150–400)
RDW: 12.7 % (ref 11.5–15.5)

## 2013-01-17 SURGERY — FIXATION, FRACTURE, INTERTROCHANTERIC, WITH INTRAMEDULLARY ROD
Anesthesia: General | Site: Hip | Laterality: Right | Wound class: Clean

## 2013-01-17 MED ORDER — KETAMINE HCL 10 MG/ML IJ SOLN
INTRAMUSCULAR | Status: DC | PRN
  Administered 2013-01-17: 50 mg via INTRAVENOUS

## 2013-01-17 MED ORDER — KCL IN DEXTROSE-NACL 20-5-0.45 MEQ/L-%-% IV SOLN
INTRAVENOUS | Status: AC
  Filled 2013-01-17: qty 1000

## 2013-01-17 MED ORDER — POLYETHYLENE GLYCOL 3350 17 G PO PACK: 17.0000 g | PACK | Freq: Every day | ORAL | Status: DC | PRN

## 2013-01-17 MED ORDER — KCL IN DEXTROSE-NACL 20-5-0.45 MEQ/L-%-% IV SOLN
INTRAVENOUS | Status: DC
  Filled 2013-01-17: qty 1000

## 2013-01-17 MED ORDER — BISACODYL 5 MG PO TBEC: 5.0000 mg | DELAYED_RELEASE_TABLET | Freq: Every day | ORAL | Status: DC | PRN

## 2013-01-17 MED ORDER — LACTATED RINGERS IV SOLN
INTRAVENOUS | Status: DC | PRN
  Administered 2013-01-17 (×2): via INTRAVENOUS

## 2013-01-17 MED ORDER — HYDROCODONE-ACETAMINOPHEN 5-325 MG PO TABS: 1.0000 | ORAL_TABLET | Freq: Four times a day (QID) | ORAL | Status: DC | PRN

## 2013-01-17 MED ORDER — CEFAZOLIN SODIUM-DEXTROSE 2-3 GM-% IV SOLR
2.0000 g | Freq: Four times a day (QID) | INTRAVENOUS | Status: AC
  Administered 2013-01-17 – 2013-01-18 (×2): 2 g via INTRAVENOUS
  Filled 2013-01-17 (×2): qty 50

## 2013-01-17 MED ORDER — OXYCODONE HCL 5 MG PO TABS
5.0000 mg | ORAL_TABLET | ORAL | Status: DC | PRN
  Administered 2013-01-17 – 2013-01-18 (×4): 10 mg via ORAL
  Filled 2013-01-17 (×4): qty 2

## 2013-01-17 MED ORDER — ASPIRIN EC 325 MG PO TBEC
325.0000 mg | DELAYED_RELEASE_TABLET | Freq: Two times a day (BID) | ORAL | Status: DC
  Administered 2013-01-17 – 2013-01-20 (×6): 325 mg via ORAL
  Filled 2013-01-17 (×7): qty 1

## 2013-01-17 MED ORDER — ACETAMINOPHEN 325 MG PO TABS: 650.0000 mg | ORAL_TABLET | Freq: Four times a day (QID) | ORAL | Status: DC | PRN

## 2013-01-17 MED ORDER — MORPHINE SULFATE 2 MG/ML IJ SOLN: 0.5000 mg | INTRAMUSCULAR | Status: DC | PRN

## 2013-01-17 MED ORDER — ONDANSETRON HCL 4 MG/2ML IJ SOLN
4.0000 mg | Freq: Once | INTRAMUSCULAR | Status: AC
  Administered 2013-01-17: 4 mg via INTRAVENOUS
  Filled 2013-01-17: qty 2

## 2013-01-17 MED ORDER — OXYCODONE HCL 5 MG PO TABS: 5.0000 mg | ORAL_TABLET | ORAL | Status: DC | PRN

## 2013-01-17 MED ORDER — METOCLOPRAMIDE HCL 5 MG/ML IJ SOLN: 5.0000 mg | Freq: Three times a day (TID) | INTRAMUSCULAR | Status: DC | PRN

## 2013-01-17 MED ORDER — HYDROMORPHONE HCL PF 1 MG/ML IJ SOLN
1.0000 mg | Freq: Once | INTRAMUSCULAR | Status: AC
  Administered 2013-01-17: 1 mg via INTRAVENOUS
  Filled 2013-01-17: qty 1

## 2013-01-17 MED ORDER — MIDAZOLAM HCL 5 MG/5ML IJ SOLN
INTRAMUSCULAR | Status: DC | PRN
  Administered 2013-01-17: 2 mg via INTRAVENOUS

## 2013-01-17 MED ORDER — DOCUSATE SODIUM 100 MG PO CAPS: 100.0000 mg | ORAL_CAPSULE | Freq: Two times a day (BID) | ORAL | Status: DC

## 2013-01-17 MED ORDER — DEXAMETHASONE SODIUM PHOSPHATE 4 MG/ML IJ SOLN
INTRAMUSCULAR | Status: DC | PRN
  Administered 2013-01-17: 10 mg via INTRAVENOUS

## 2013-01-17 MED ORDER — PHENOL 1.4 % MT LIQD
1.0000 | OROMUCOSAL | Status: DC | PRN
  Filled 2013-01-17: qty 177

## 2013-01-17 MED ORDER — FENTANYL CITRATE 0.05 MG/ML IJ SOLN
INTRAMUSCULAR | Status: DC | PRN
  Administered 2013-01-17 (×2): 50 ug via INTRAVENOUS

## 2013-01-17 MED ORDER — ACETAMINOPHEN 650 MG RE SUPP: 650.0000 mg | Freq: Four times a day (QID) | RECTAL | Status: DC | PRN

## 2013-01-17 MED ORDER — HYDROCODONE-ACETAMINOPHEN 5-325 MG PO TABS
1.0000 | ORAL_TABLET | Freq: Four times a day (QID) | ORAL | Status: DC | PRN
  Administered 2013-01-18 – 2013-01-19 (×7): 2 via ORAL
  Administered 2013-01-20 (×2): 1 via ORAL
  Filled 2013-01-17 (×7): qty 2
  Filled 2013-01-17: qty 1
  Filled 2013-01-17: qty 2

## 2013-01-17 MED ORDER — METOCLOPRAMIDE HCL 5 MG/ML IJ SOLN
INTRAMUSCULAR | Status: DC | PRN
Start: 2013-01-17 — End: 2013-01-17
  Administered 2013-01-17: 10 mg via INTRAVENOUS

## 2013-01-17 MED ORDER — HYDROMORPHONE HCL PF 1 MG/ML IJ SOLN
INTRAMUSCULAR | Status: AC
  Filled 2013-01-17: qty 1

## 2013-01-17 MED ORDER — CEFAZOLIN SODIUM-DEXTROSE 2-3 GM-% IV SOLR
INTRAVENOUS | Status: AC
  Filled 2013-01-17: qty 50

## 2013-01-17 MED ORDER — HYDROMORPHONE HCL PF 1 MG/ML IJ SOLN
INTRAMUSCULAR | Status: DC | PRN
  Administered 2013-01-17: 2 mg via INTRAVENOUS

## 2013-01-17 MED ORDER — MENTHOL 3 MG MT LOZG
1.0000 | LOZENGE | OROMUCOSAL | Status: DC | PRN
  Filled 2013-01-17: qty 9

## 2013-01-17 MED ORDER — DOCUSATE SODIUM 100 MG PO CAPS
100.0000 mg | ORAL_CAPSULE | Freq: Two times a day (BID) | ORAL | Status: DC
  Administered 2013-01-17 – 2013-01-20 (×6): 100 mg via ORAL

## 2013-01-17 MED ORDER — FENTANYL CITRATE 0.05 MG/ML IJ SOLN
INTRAMUSCULAR | Status: AC
  Filled 2013-01-17: qty 2

## 2013-01-17 MED ORDER — EPHEDRINE SULFATE 50 MG/ML IJ SOLN
INTRAMUSCULAR | Status: DC | PRN
  Administered 2013-01-17 (×2): 5 mg via INTRAVENOUS
  Administered 2013-01-17: 10 mg via INTRAVENOUS

## 2013-01-17 MED ORDER — ALUM & MAG HYDROXIDE-SIMETH 200-200-20 MG/5ML PO SUSP: 30.0000 mL | ORAL | Status: DC | PRN

## 2013-01-17 MED ORDER — 0.9 % SODIUM CHLORIDE (POUR BTL) OPTIME
TOPICAL | Status: DC | PRN
  Administered 2013-01-17: 1000 mL

## 2013-01-17 MED ORDER — CEFAZOLIN SODIUM-DEXTROSE 2-3 GM-% IV SOLR
INTRAVENOUS | Status: DC | PRN
  Administered 2013-01-17: 2 g via INTRAVENOUS

## 2013-01-17 MED ORDER — METOCLOPRAMIDE HCL 10 MG PO TABS: 5.0000 mg | ORAL_TABLET | Freq: Three times a day (TID) | ORAL | Status: DC | PRN

## 2013-01-17 MED ORDER — MORPHINE SULFATE 2 MG/ML IJ SOLN
0.5000 mg | INTRAMUSCULAR | Status: DC | PRN
  Administered 2013-01-17: 0.5 mg via INTRAVENOUS
  Filled 2013-01-17: qty 1

## 2013-01-17 MED ORDER — ONDANSETRON HCL 4 MG PO TABS: 4.0000 mg | ORAL_TABLET | Freq: Four times a day (QID) | ORAL | Status: DC | PRN

## 2013-01-17 MED ORDER — CEFAZOLIN SODIUM-DEXTROSE 2-3 GM-% IV SOLR: 2.0000 g | INTRAVENOUS | Status: DC

## 2013-01-17 MED ORDER — SUCCINYLCHOLINE CHLORIDE 20 MG/ML IJ SOLN
INTRAMUSCULAR | Status: DC | PRN
  Administered 2013-01-17: 100 mg via INTRAVENOUS

## 2013-01-17 MED ORDER — ACETAMINOPHEN 10 MG/ML IV SOLN
INTRAVENOUS | Status: DC | PRN
  Administered 2013-01-17: 1000 mg via INTRAVENOUS

## 2013-01-17 MED ORDER — HYDROMORPHONE HCL PF 1 MG/ML IJ SOLN
0.2500 mg | INTRAMUSCULAR | Status: DC | PRN
  Administered 2013-01-17 (×2): 0.25 mg via INTRAVENOUS

## 2013-01-17 MED ORDER — PROMETHAZINE HCL 25 MG/ML IJ SOLN: 6.2500 mg | INTRAMUSCULAR | Status: DC | PRN

## 2013-01-17 MED ORDER — KCL IN DEXTROSE-NACL 20-5-0.45 MEQ/L-%-% IV SOLN
INTRAVENOUS | Status: DC
  Filled 2013-01-17 (×5): qty 1000

## 2013-01-17 MED ORDER — ONDANSETRON HCL 4 MG/2ML IJ SOLN: 4.0000 mg | Freq: Four times a day (QID) | INTRAMUSCULAR | Status: DC | PRN

## 2013-01-17 MED ORDER — FLEET ENEMA 7-19 GM/118ML RE ENEM: 1.0000 | ENEMA | Freq: Once | RECTAL | Status: AC | PRN

## 2013-01-17 MED ORDER — ZOLPIDEM TARTRATE 5 MG PO TABS: 5.0000 mg | ORAL_TABLET | Freq: Every evening | ORAL | Status: DC | PRN

## 2013-01-17 MED ORDER — ONDANSETRON HCL 4 MG/2ML IJ SOLN
INTRAMUSCULAR | Status: DC | PRN
  Administered 2013-01-17: 4 mg via INTRAVENOUS

## 2013-01-17 MED ORDER — PROPOFOL 10 MG/ML IV BOLUS
INTRAVENOUS | Status: DC | PRN
  Administered 2013-01-17: 200 mg via INTRAVENOUS

## 2013-01-17 MED ORDER — ACETAMINOPHEN 10 MG/ML IV SOLN
INTRAVENOUS | Status: AC
  Filled 2013-01-17: qty 100

## 2013-01-17 SURGICAL SUPPLY — 25 items
BANDAGE GAUZE ELAST BULKY 4 IN (GAUZE/BANDAGES/DRESSINGS) ×2 IMPLANT
BIT DRILL CANN LG 4.3MM (BIT) ×1 IMPLANT
CLOTH BEACON ORANGE TIMEOUT ST (SAFETY) ×2 IMPLANT
DRAPE LG THREE QUARTER DISP (DRAPES) ×2 IMPLANT
DRAPE STERI IOBAN 125X83 (DRAPES) ×2 IMPLANT
DRILL BIT CANN LG 4.3MM (BIT) ×2
DRSG MEPILEX BORDER 4X4 (GAUZE/BANDAGES/DRESSINGS) ×2 IMPLANT
DRSG MEPILEX BORDER 4X8 (GAUZE/BANDAGES/DRESSINGS) ×2 IMPLANT
DURAPREP 26ML APPLICATOR (WOUND CARE) ×2 IMPLANT
GLOVE BIO SURGEON STRL SZ7.5 (GLOVE) ×2 IMPLANT
GLOVE BIOGEL PI IND STRL 8 (GLOVE) ×1 IMPLANT
GLOVE BIOGEL PI INDICATOR 8 (GLOVE) ×1
GUIDEPIN 3.2X17.5 THRD DISP (PIN) ×4 IMPLANT
HIP FR NAIL LAG SCREW 10.5X110 (Orthopedic Implant) ×2 IMPLANT
NAIL HIP FRACT 130D 11X180 (Screw) ×2 IMPLANT
NS IRRIG 1000ML POUR BTL (IV SOLUTION) ×2 IMPLANT
PACK GENERAL/GYN (CUSTOM PROCEDURE TRAY) ×2 IMPLANT
PAD CAST 4YDX4 CTTN HI CHSV (CAST SUPPLIES) ×1 IMPLANT
PADDING CAST COTTON 4X4 STRL (CAST SUPPLIES) ×2
PADDING CAST COTTON 6X4 STRL (CAST SUPPLIES) ×2 IMPLANT
POSITIONER SURGICAL ARM (MISCELLANEOUS) ×2 IMPLANT
SCREW BONE CORTICAL 5.0X3 (Screw) ×2 IMPLANT
SCREW LAG HIP FR NAIL 10.5X110 (Orthopedic Implant) ×1 IMPLANT
STAPLER VISISTAT 35W (STAPLE) ×2 IMPLANT
TOWEL OR NON WOVEN STRL DISP B (DISPOSABLE) ×2 IMPLANT

## 2013-01-17 NOTE — Transfer of Care (Signed)
Immediate Anesthesia Transfer of Care Note  Patient: Douglas Ferguson  Procedure(s) Performed: Procedure(s): INTRAMEDULLARY (IM) NAIL INTERTROCHANTRIC (Right)  Patient Location: PACU  Anesthesia Type:General  Level of Consciousness: awake, patient cooperative and responds to stimulation  Airway & Oxygen Therapy: Patient Spontanous Breathing and Patient connected to face mask, drowsy, ventilating well  Post-op Assessment: Report given to PACU RN, Post -op Vital signs reviewed and stable and Patient moving all extremities X 4  Post vital signs: Reviewed and stable  Complications: No apparent anesthesia complications

## 2013-01-17 NOTE — Op Note (Signed)
Procedure(s): INTRAMEDULLARY (IM) NAIL INTERTROCHANTRIC Procedure Note  Douglas Ferguson male 52 y.o. 01/17/2013  Procedure(s) and Anesthesia Type:   Right hip trochanteric nail fixation  Surgeon(s) and Role:    * Mable Paris, MD - Primary   Indications:  52 y.o. male s/p fall on the ice with direct impact on the right hip with intertrochanteric right hip fracture. Indicated for surgery to promote early ambulation, pain control and prevent complications of bed rest.     Surgeon: Mable Paris   Assistants: Damita Lack PA-C (Danielle was present and scrubbed throughout the procedure and was essential in positioning, retraction, exposure, and closure)  Anesthesia: General endotracheal anesthesia     Procedure Detail  INTRAMEDULLARY (IM) NAIL INTERTROCHANTRIC  Findings: DePuy affixes 11 mm short intramedullary nail with a 110 mm proximal lag screw and a 34 mm distal interlocking screw. Anatomic alignment the fracture with compression.  Estimated Blood Loss:  less than 100 mL         Drains: none  Blood Given: none          Specimens: none        Complications:  * No complications entered in OR log *         Disposition: PACU - hemodynamically stable.         Condition: stable    Procedure:  The patient was identified in the preoperative holding area  where I personally marked the operative site after verifying site, side,  and procedure with the patient. She was taken back to the operating  room where general anesthesia was induced without complication. he was  placed on the fracture table with the right lower extremity in traction,  and opposite lower extremity in a flexed abducted position. The arms were well  padded. Fluoroscopic imaging was used to verify reduction with gentle traction  and internal rotation. The right hip was then prepped and draped in the standard sterile fashion. An approximately 3 cm incision was made proximal   to the palpable greater trochanter tip. Dissection was carried down to  the tip and the short guidewire was placed under fluoroscopic imaging.  The proximal entry reamer was used to open the canal the 11 mm short nail was then  advanced without difficulty and using the proximal jig a small 1.5 cm incision was made on the lateral thigh to  advance the lag screw guide against the lateral aspect of the femur.  The guide pin was advanced and its position was verified in AP and  lateral planes to be centered in the head.  The guidewire was over  reamed and the appropriate size lag screw was advanced. The compression we'll was used to provide some compression at the fracture The  proximal set screw was then advanced, backed off a quarter turn to allow  sliding. AP and lateral imaging demonstrated appropriate position of  the screw and reduction of the fracture. The distal interlocking screw was then drilled measured and filled with the appropriate size screw after using the interlocking jig to target the area and a small stab incision was made. The proximal jig was then  removed. Final fluoroscopic imaging in AP and lateral planes at the hip demonstrated near anatomic reduction with appropriate length and  position of the hardware. All wounds were then copiously irrigated with  normal saline and subsequently closed in layers with #1 Vicryl in a deep  fascia layer, 2-0 Vicryl in a deep dermal layer, and staples for skin  closure. Sterile dressings were then applied including 4x4s and Mepilex  dressings. The patient was then taken off the fracture table,  transferred to the stretcher, and taken to the recovery room in stable  condition after he was extubated.   POSTOPERATIVE PLAN: he will be weightbearing as tolerated on the operative extremity. he will have DVT prophylaxis of aspirin and SCDs.

## 2013-01-17 NOTE — ED Notes (Signed)
Patient transported to CT 

## 2013-01-17 NOTE — ED Notes (Signed)
Per EMS Patient attempting to shovel ice. Fell on right side. No LOC did not hit head. Denies head neck or back pain. C/O right hip pain. Negative for shortening of rotation.No obvious deformity. Pain with palpation of right hip.

## 2013-01-17 NOTE — Preoperative (Signed)
Beta Blockers   Reason not to administer Beta Blockers:Not Applicable, not on home BB 

## 2013-01-17 NOTE — ED Provider Notes (Signed)
Medical screening examination/treatment/procedure(s) were performed by non-physician practitioner and as supervising physician I was immediately available for consultation/collaboration.   Loren Racer, MD 01/17/13 [id number]

## 2013-01-17 NOTE — ED Notes (Signed)
ZOX:[id number]<AV> Expected date:01/17/13<BR> Expected time: 9:20 AM<BR> Means of arrival:<BR> Comments:<BR> Fall

## 2013-01-17 NOTE — Anesthesia Preprocedure Evaluation (Addendum)
Anesthesia Evaluation  Patient identified by MRN, date of birth, ID band Patient awake    Reviewed: Allergy & Precautions, H&P , NPO status , Patient's Chart, lab work & pertinent test results  Airway Mallampati: III TM Distance: <3 FB Neck ROM: Full    Dental no notable dental hx.    Pulmonary neg pulmonary ROS,  breath sounds clear to auscultation  Pulmonary exam normal       Cardiovascular negative cardio ROS  Rhythm:Regular Rate:Normal     Neuro/Psych negative neurological ROS  negative psych ROS   GI/Hepatic negative GI ROS, Neg liver ROS,   Endo/Other  Morbid obesity  Renal/GU negative Renal ROS  negative genitourinary   Musculoskeletal negative musculoskeletal ROS (+)   Abdominal   Peds negative pediatric ROS (+)  Hematology negative hematology ROS (+)   Anesthesia Other Findings   Reproductive/Obstetrics negative OB ROS                          Anesthesia Physical Anesthesia Plan  ASA: II and emergent  Anesthesia Plan: General   Post-op Pain Management:    Induction: Intravenous  Airway Management Planned: Oral ETT  Additional Equipment:   Intra-op Plan:   Post-operative Plan: Extubation in OR  Informed Consent: I have reviewed the patients History and Physical, chart, labs and discussed the procedure including the risks, benefits and alternatives for the proposed anesthesia with the patient or authorized representative who has indicated his/her understanding and acceptance.   Dental advisory given  Plan Discussed with: CRNA and Surgeon  Anesthesia Plan Comments:         Anesthesia Quick Evaluation

## 2013-01-17 NOTE — H&P (Signed)
Douglas Ferguson is an 52 y.o. male.   Chief Complaint: Right hip pain HPI: This is a very pleasant 52 year old male who slipped on the ice this morning falling hard onto his right hip. He had immediate pain and inability to weight-bear. He was seen in the emergency department where x-rays revealed an intertrochanteric proximal femur fracture. I was called for evaluation and management. Pain is worse with any movement, better with rest. The pain is deep lateral and anterior in the right hip.  Past Medical History  Diagnosis Date  . Shoulder pain, left     prn NSAIDS  . Varicose veins   . Migraines     Past Surgical History  Procedure Laterality Date  . Rotator cuff repair      left  . Vasectomy    . Wisdom tooth extraction    . Colonoscopy  05/2011    negative;  GI  . Esophagogastroduodenoscopy  12/19/2011    Procedure: ESOPHAGOGASTRODUODENOSCOPY (EGD);  Surgeon: Erick Blinks, MD;  Location: [facility];  Service: Gastroenterology;  Laterality: N/A;    Family History  Problem Relation Age of Onset  . Lung cancer Father   . Hypertension Mother   . Hypertension Father    Social History:  reports that he quit smoking about 28 years ago. He has never used smokeless tobacco. He reports that  drinks alcohol. He reports that he does not use illicit drugs.  Allergies: No Known Allergies   (Not in a hospital admission)  Results for orders placed during the hospital encounter of 01/17/13 (from the past 48 hour(s))  CBC     Status: Abnormal   Collection Time    01/17/13 11:25 AM      Result Value Range   WBC 13.1 (*) 4.0 - 10.5 K/uL   RBC 5.22  4.22 - 5.81 MIL/uL   Hemoglobin 15.9  13.0 - 17.0 g/dL   HCT 29.5  62.1 - 30.8 %   MCV 86.4  78.0 - 100.0 fL   MCH 30.5  26.0 - 34.0 pg   MCHC 35.3  30.0 - 36.0 g/dL   RDW 65.7  84.6 - 96.2 %   Platelets 250  150 - 400 K/uL  BASIC METABOLIC PANEL     Status: Abnormal   Collection Time    01/17/13 11:25 AM      Result Value Range    Sodium 137  135 - 145 mEq/L   Potassium 3.6  3.5 - 5.1 mEq/L   Chloride 102  96 - 112 mEq/L   CO2 27  19 - 32 mEq/L   Glucose, Bld 121 (*) 70 - 99 mg/dL   BUN 19  6 - 23 mg/dL   Creatinine, Ser 9.52  0.50 - 1.35 mg/dL   Calcium 9.2  8.4 - 84.1 mg/dL   GFR calc non Af Amer >90  >90 mL/min   GFR calc Af Amer >90  >90 mL/min   Comment:            The eGFR has been calculated     using the CKD EPI equation.     This calculation has not been     validated in all clinical     situations.     eGFR's persistently     <90 mL/min signify     possible Chronic Kidney Disease.   Dg Hip Complete Right  01/17/2013  *RADIOLOGY REPORT*  Clinical Data: Fall, right hip pain  RIGHT HIP - COMPLETE 2+ VIEW  Comparison: None.  Findings: Nondisplaced intertrochanteric right hip fracture.  The bilateral joint spaces are preserved.  Visualized bony pelvis appears intact.  IMPRESSION: Nondisplaced intertrochanteric right hip fracture.   Original Report Authenticated By: Charline Bills, M.D.     Review of Systems  All other systems reviewed and are negative.    Blood pressure 128/62, pulse 68, temperature 99.4 F (37.4 C), temperature source Oral, resp. rate 16, height 6' (1.829 m), weight 113.399 kg (250 lb), SpO2 95.00%. Physical Exam  Constitutional: He is oriented to person, place, and time. He appears well-developed and well-nourished.  HENT:  Head: Atraumatic.  Eyes: EOM are normal.  Cardiovascular: Intact distal pulses.   Respiratory: Effort normal.  Musculoskeletal:       Right hip: He exhibits decreased range of motion, decreased strength and tenderness. He exhibits no deformity.  Neurological: He is alert and oriented to person, place, and time.  Skin: Skin is warm and dry.  Psychiatric: He has a normal mood and affect.     Assessment/Plan Right hip intertrochanteric proximal femur fracture Recommended surgical fixation to allow early ambulation and prevent displacement. Discussed  risks benefits alternatives to surgery the patient agrees to light before with this. He will be admitted to the orthopedic service and will have his surgery this afternoon.  Mable Paris 01/17/2013, 1:16 PM

## 2013-01-17 NOTE — ED Provider Notes (Signed)
History     CSN: [id number]  Arrival date & time 01/17/13  4098   First MD Initiated Contact with Patient 01/17/13 1009      Chief Complaint  Patient presents with  . Fall    (Consider location/radiation/quality/duration/timing/severity/associated sxs/prior treatment) Patient is a 52 y.o. male presenting with fall. The history is provided by the patient.  Fall The accident occurred less than 1 hour ago. The fall occurred while walking (slipped on ice ). He fell from a height of 3 to 5 ft. He landed on concrete. There was no blood loss. The point of impact was the right hip. The pain is present in the right hip. The pain is at a severity of 8/10. The pain is moderate. He was not ambulatory at the scene. There was no entrapment after the fall. There was no drug use involved in the accident. There was no alcohol use involved in the accident. Pertinent negatives include no visual change, no fever, no nausea, no vomiting, no headaches, no loss of consciousness and no tingling. The symptoms are aggravated by activity, use of the injured limb, pressure on the injury, standing and rotation. He has tried nothing for the symptoms. The treatment provided no relief.    Past Medical History  Diagnosis Date  . Shoulder pain, left     prn NSAIDS  . Varicose veins   . Migraines     Past Surgical History  Procedure Laterality Date  . Rotator cuff repair      left  . Vasectomy    . Wisdom tooth extraction    . Colonoscopy  05/2011    negative; [facility]  . Esophagogastroduodenoscopy  12/19/2011    Procedure: ESOPHAGOGASTRODUODENOSCOPY (EGD);  Surgeon: Erick Blinks, MD;  Location: [facility];  Service: Gastroenterology;  Laterality: N/A;    Family History  Problem Relation Age of Onset  . Lung cancer Father   . Hypertension Mother   . Hypertension Father     History  Substance Use Topics  . Smoking status: Former Smoker    Quit date: 04/16/1984  . Smokeless tobacco: Never Used  .  Alcohol Use: 0.0 oz/week     Comment:  very rarely      Review of Systems  Constitutional: Negative for fever.  Gastrointestinal: Negative for nausea and vomiting.  Neurological: Negative for tingling, loss of consciousness and headaches.  All other systems reviewed and are negative.    Allergies  Review of patient's allergies indicates no known allergies.  Home Medications   Current Outpatient Rx  Name  Route  Sig  Dispense  Refill  . ibuprofen (ADVIL,MOTRIN) 200 MG tablet   Oral   Take 200 mg by mouth every 6 (six) hours as needed for pain. Pain           BP 128/62  Pulse 68  Temp(Src) 99.4 F (37.4 C) (Oral)  Resp 16  Ht 6' (1.829 m)  Wt 250 lb (113.399 kg)  BMI 33.9 kg/m2  SpO2 95%  Physical Exam  Nursing note and vitals reviewed. Constitutional: He appears well-developed and well-nourished. No distress.  HENT:  Head: Normocephalic and atraumatic.  Eyes: Conjunctivae and EOM are normal.  Neck: Normal range of motion. Neck supple.  Cardiovascular:  Intact distal pulses, capillary refill < 3 seconds  Musculoskeletal:  Right hip ttp, unable to perform RLE ROM d/t significant pain with any movement. Leg length equal bilaterally without inversion or eversion. All other extremities with normal ROM  Neurological:  No sensory deficit  Skin: He is not diaphoretic.  Skin intact, no tenting    ED Course  Procedures (including critical care time)  Labs Reviewed  CBC  BASIC METABOLIC PANEL   Dg Hip Complete Right  01/17/2013  *RADIOLOGY REPORT*  Clinical Data: Fall, right hip pain  RIGHT HIP - COMPLETE 2+ VIEW  Comparison: None.  Findings: Nondisplaced intertrochanteric right hip fracture.  The bilateral joint spaces are preserved.  Visualized bony pelvis appears intact.  IMPRESSION: Nondisplaced intertrochanteric right hip fracture.   Original Report Authenticated By: Charline Bills, M.D.      No diagnosis found.  Consult: Orthopedics, Dr. Ave Filter - to  admit pt for surgery. Last meal 7:30 AM (bacon, egg, cheese biscuit)  MDM  Nondisplaced intertrochanteric right hip fracture  52 year old male with no significant past medical history presents to the emergency department status post mechanical fall complaining of right hip pain.  Imaging reviewed as above indicating right hip fracture.  Patient neurovascularly intact on exam (right pedal pulse weaker than left, however still palpable).  Pain managed in the emergency department.  Consult orthopedics as above.  Preoperative labs ordered. The patient appears reasonably stabilized for admission considering the current resources, flow, and capabilities available in the ED at this time, and I doubt any other [facility] requiring further screening and/or treatment in the ED prior to admission.         Jaci Carrel, [place] 01/17/13 1143

## 2013-01-17 NOTE — Anesthesia Postprocedure Evaluation (Signed)
  Anesthesia Post-op Note  Patient: Douglas Ferguson  Procedure(s) Performed: Procedure(s) (LRB): INTRAMEDULLARY (IM) NAIL INTERTROCHANTRIC (Right)  Patient Location: PACU  Anesthesia Type: General  Level of Consciousness: awake and alert   Airway and Oxygen Therapy: Patient Spontanous Breathing  Post-op Pain: mild  Post-op Assessment: Post-op Vital signs reviewed, Patient's Cardiovascular Status Stable, Respiratory Function Stable, Patent Airway and No signs of Nausea or vomiting  Last Vitals:  Filed Vitals:   01/17/13 1445  BP: 107/53  Pulse:   Temp:   Resp:     Post-op Vital Signs: stable   Complications: No apparent anesthesia complications

## 2013-01-18 LAB — CBC
MCH: 30.3 pg (ref 26.0–34.0)
MCHC: 34.8 g/dL (ref 30.0–36.0)
MCV: 87.1 fL (ref 78.0–100.0)
Platelets: 249 10*3/uL (ref 150–400)
RBC: 4.72 MIL/uL (ref 4.22–5.81)

## 2013-01-18 LAB — BASIC METABOLIC PANEL
CO2: 26 mEq/L (ref 19–32)
Calcium: 8.6 mg/dL (ref 8.4–10.5)
Creatinine, Ser: 0.86 mg/dL (ref 0.50–1.35)
GFR calc non Af Amer: >90 mL/min (ref >90)
Glucose, Bld: 166 mg/dL — ABNORMAL HIGH (ref 70–99)

## 2013-01-18 MED ORDER — HYDROCODONE-ACETAMINOPHEN 5-325 MG PO TABS: 1.0000 | ORAL_TABLET | ORAL | Status: DC | PRN

## 2013-01-18 MED ORDER — ASPIRIN 325 MG PO TBEC: 325.0000 mg | DELAYED_RELEASE_TABLET | Freq: Two times a day (BID) | ORAL | Status: DC

## 2013-01-18 NOTE — Evaluation (Signed)
Physical Therapy Evaluation Patient Details Name: Douglas Ferguson MRN: [id number] DOB: 04/01/1961 Today's Date: 01/18/2013 Time: 1093-2355 PT Time Calculation (min): 43 min  PT Assessment / Plan / Recommendation Clinical Impression  52 y.o. male who slipped and fell on ice sustaining a R intertrochanteric hip fx, s/p IM nail. Pt ambulated 110' with RW. He likely will be ready to DC home tomorrow.     PT Assessment  Patient needs continued PT services    Follow Up Recommendations  Home health PT    Does the patient have the potential to tolerate intense rehabilitation      Barriers to Discharge None      Equipment Recommendations  Rolling walker with 5" wheels;Other (comment) (3 in 1 if family doesn't have one)    Recommendations for Other Services     Frequency 7X/week    Precautions / Restrictions Precautions Precautions: Fall Restrictions Weight Bearing Restrictions: No Other Position/Activity Restrictions: WBAT   Pertinent Vitals/Pain *5/10 R hip with walking Premedicated, ice applied**      Mobility  Bed Mobility Bed Mobility: Supine to Sit Supine to Sit: 3: Mod assist Details for Bed Mobility Assistance: assist for RLE,VCs for technique Transfers Transfers: Sit to Stand;Stand to Sit Sit to Stand: 4: Min guard;From bed;With upper extremity assist Stand to Sit: 4: Min guard;To chair/3-in-1;With upper extremity assist;With armrests Ambulation/Gait Ambulation Distance (Feet): 110 Feet Assistive device: Rolling walker Ambulation/Gait Assistance Details: distance limited by fatigue, good sequencing Gait Pattern: Step-to pattern    Exercises Total Joint Exercises Ankle Circles/Pumps: AROM;Both;20 reps;Supine Quad Sets: AROM;Both;10 reps Heel Slides: AAROM;Right;15 reps;Supine Hip ABduction/ADduction: AAROM;Right;10 reps;Supine   PT Diagnosis: Difficulty walking;Acute pain  PT Problem List: Decreased strength;Decreased activity tolerance;Decreased  mobility;Pain;Decreased knowledge of use of DME PT Treatment Interventions: DME instruction;Gait training;Stair training;Functional mobility training;Therapeutic exercise;Patient/family education   PT Goals Acute Rehab PT Goals PT Goal Formulation: With patient Time For Goal Achievement: 01/18/13 Potential to Achieve Goals: Good Pt will go Supine/Side to Sit: with supervision PT Goal: Supine/Side to Sit - Progress: Goal set today Pt will go Sit to Stand: with modified independence PT Goal: Sit to Stand - Progress: Goal set today Pt will go Stand to Sit: with modified independence PT Goal: Stand to Sit - Progress: Goal set today Pt will Ambulate: >150 feet;with modified independence;with rolling walker PT Goal: Ambulate - Progress: Goal set today Pt will Go Up / Down Stairs: 3-5 stairs;with rolling walker;with min assist PT Goal: Up/Down Stairs - Progress: Goal set today Pt will Perform Home Exercise Program: with min assist PT Goal: Perform Home Exercise Program - Progress: Goal set today  Visit Information  Last PT Received On: 01/18/13 Assistance Needed: +1    Subjective Data  Subjective: I'm really ready to walk.  Patient Stated Goal: return to work   Prior [employer] Living Lives With: Spouse;Son Available Help at Discharge: Family Type of Home: House Home Access: Stairs to enter Secretary/administrator of Steps: 3 Entrance Stairs-Rails: None Home Layout: Two level;Able to live on main level with bedroom/bathroom Alternate Level Stairs-Number of Steps: flight Bathroom Shower/Tub: Walk-in shower;Tub/shower unit Home Adaptive Equipment: Walker - standard;Straight cane Prior Function Level of Independence: Independent Able to Take Stairs?: Yes Driving: Yes Vocation: Full time employment Comments: Education officer, community Communication: No difficulties    Cognition  Cognition Overall Cognitive Status: Appears within functional limits for tasks  assessed/performed Arousal/Alertness: Awake/alert Orientation Level: Appears intact for tasks assessed Behavior During Session: [facility] for tasks performed  Extremity/Trunk Assessment Right Upper Extremity Assessment RUE ROM/Strength/Tone: Within functional levels Left Upper Extremity Assessment LUE ROM/Strength/Tone: Within functional levels Right Lower Extremity Assessment RLE ROM/Strength/Tone: Deficits;Due to pain RLE ROM/Strength/Tone Deficits: hip 2/5, limited by pain, knee ext -3/5 RLE Sensation: WFL - Light Touch RLE Coordination: WFL - gross/fine motor Left Lower Extremity Assessment LLE ROM/Strength/Tone: Within functional levels LLE Sensation: WFL - Light Touch LLE Coordination: WFL - gross/fine motor Trunk Assessment Trunk Assessment: Normal   Balance    End of Session PT - End of Session Equipment Utilized During Treatment: Gait belt Activity Tolerance: Patient tolerated treatment well Patient left: in chair;with call bell/phone within reach;with family/visitor present Nurse Communication: Mobility status  GP     Douglas Ferguson 01/18/2013, 1:14 PM [phone]

## 2013-01-18 NOTE — Progress Notes (Signed)
PATIENT ID: Douglas Ferguson   1 Day Post-Op Procedure(s) (LRB): INTRAMEDULLARY (IM) NAIL INTERTROCHANTRIC (Right)  Subjective: moderate pain overnight, now well controlled with norco.  No other c/o.  Objective:  Filed Vitals:   01/18/13 0500  BP: 129/71  Pulse: 65  Temp: 98.5 F (36.9 C)  Resp: 16     RLE dressings c/d/i. Mild swelling. NVID.  Labs:   Recent Labs  01/17/13 1125 01/18/13 0416  HGB 15.9 14.3   Recent Labs  01/17/13 1125 01/18/13 0416  WBC 13.1* 13.7*  RBC 5.22 4.72  HCT 45.1 41.1  PLT 250 249   Recent Labs  01/17/13 1125 01/18/13 0416  NA 137 134*  K 3.6 3.9  CL 102 100  CO2 27 26  BUN 19 14  CREATININE 0.93 0.86  GLUCOSE 121* 166*  CALCIUM 9.2 8.6    Assessment and Plan:Doing well POD1 WBAT with PT Likely d/c home tomorrow  VTE proph: ECASA BID and SCDs

## 2013-01-18 NOTE — Progress Notes (Signed)
CSW consult received.  Attempted to meet with Pt.  PT at bedside stated that Pt will not need SNF; HH is indicated.  No further CSW needs.  CSW to sign off.  Providence Crosby, LCSWA Clinical Social Work [phone]

## 2013-01-19 ENCOUNTER — Encounter (HOSPITAL_COMMUNITY): Payer: Self-pay | Admitting: Orthopedic Surgery

## 2013-01-19 LAB — CBC
HCT: 38.6 % — ABNORMAL LOW (ref 39.0–52.0)
Hemoglobin: 13.2 g/dL (ref 13.0–17.0)
RBC: 4.37 MIL/uL (ref 4.22–5.81)
WBC: 14.7 10*3/uL — ABNORMAL HIGH (ref 4.0–10.5)

## 2013-01-19 LAB — BASIC METABOLIC PANEL
BUN: 16 mg/dL (ref 6–23)
Chloride: 105 mEq/L (ref 96–112)
Glucose, Bld: 117 mg/dL — ABNORMAL HIGH (ref 70–99)
Potassium: 3.9 mEq/L (ref 3.5–5.1)
Sodium: 139 mEq/L (ref 135–145)

## 2013-01-19 NOTE — Progress Notes (Signed)
Received order for hospital bed.  Order has been processed and will be delivered to patients home today.

## 2013-01-19 NOTE — Progress Notes (Signed)
Physical Therapy Treatment Patient Details Name: Douglas Ferguson MRN: [id number] DOB: 12-18-1960 Today's Date: 01/19/2013 Time: 1112-1200 PT Time Calculation (min): 48 min  PT Assessment / Plan / Recommendation Comments on Treatment Session  Assisted pt OOB to amb in hallway, performed steps with spouse then returned to room.  Instructed pt on use of ICE and increased pain/swelling due to double trama of fall plus sugery.    Follow Up Recommendations  Home health PT     Does the patient have the potential to tolerate intense rehabilitation     Barriers to Discharge        Equipment Recommendations  None recommended by PT (pt using a family members RW so adjusted to proper height)    Recommendations for Other Services    Frequency 7X/week   Plan      Precautions / Restrictions Precautions Precautions: Fall Restrictions Weight Bearing Restrictions: No Other Position/Activity Restrictions: WBAT   Pertinent Vitals/Pain C/o 6/10 ICE applied Pre medicated    Mobility  Bed Mobility Bed Mobility: Sitting - Scoot to Edge of Bed Supine to Sit: 5: Supervision Details for Bed Mobility Assistance: Instructed pt how to use a belt to assist R LE off bed.  Performed at supervision level with increased time. Transfers Transfers: Sit to Stand;Stand to Sit Sit to Stand: 5: Supervision;From bed;From chair/3-in-1 Stand to Sit: 5: Supervision;To chair/3-in-1 Details for Transfer Assistance: increased time  Ambulation/Gait Ambulation/Gait Assistance: 5: Supervision Ambulation Distance (Feet): 85 Feet Assistive device: Rolling walker Ambulation/Gait Assistance Details: increased time and 25% VC's on proper walker to self distance to decrease posterior LOB as pt was stepping too far to the front. Gait Pattern: Step-to pattern;Decreased stance time - right Gait velocity: decreased Stairs: Yes Stairs Assistance: 4: Min assist Stairs Assistance Details (indicate cue type and reason): with  spouse and 50% VC's on proper sequencing and safety.   Stair Management Technique: No rails;Backwards;With walker     PT Goals                                             progressing    Visit Information  Last PT Received On: 01/19/13    Subjective Data  Patient Stated Goal: home   Cognition    good   Balance   good  End of Session PT - End of Session Equipment Utilized During Treatment: Gait belt Activity Tolerance: Patient tolerated treatment well Patient left: in chair;with call bell/phone within reach;with family/visitor present Nurse Communication: Mobility status   [name]  PTA [facility]  Acute  Rehab Pager      [phone]

## 2013-01-19 NOTE — Progress Notes (Signed)
OT Cancellation Note  Patient Details Name: Douglas Ferguson MRN: [id number] DOB: [birth year]   Cancelled Treatment:     Pt screened for OT.  No needs.  Will sponge bathe, has adl help and 3:1 will be delivered.    Kaitland Lewellyn 01/19/2013, 11:01 AM Marica Otter, OTR/L [phone] 01/19/2013

## 2013-01-19 NOTE — Progress Notes (Signed)
PATIENT ID: Douglas Ferguson   2 Days Post-Op Procedure(s) (LRB): INTRAMEDULLARY (IM) NAIL INTERTROCHANTRIC (Right)  Subjective: Reports minimal pain overnight, well controlled with norco. No other complaints or concerns. Ready to go home today, would like another PT session first.  Objective:  Filed Vitals:   01/19/13 0751  BP:   Pulse:   Temp:   Resp: 16     RLE dressings c/d/i. Mild swelling No surrounding erythema Wiggles toes, distally NVI   Labs:   Recent Labs  01/17/13 1125 01/18/13 0416 01/19/13 0410  HGB 15.9 14.3 13.2   Recent Labs  01/18/13 0416 01/19/13 0410  WBC 13.7* 14.7*  RBC 4.72 4.37  HCT 41.1 38.6*  PLT 249 235   Recent Labs  01/18/13 0416 01/19/13 0410  NA 134* 139  K 3.9 3.9  CL 100 105  CO2 26 27  BUN 14 16  CREATININE 0.86 0.95  GLUCOSE 166* 117*  CALCIUM 8.6 8.3*    Assessment and Plan: 3 days p/o right IM intertrochantric nail WBAT with PT Can d/c home today Norco for home pain control, ASA 325mg  BID, scripts in chart  VTE proph: SCDs, ASA 325mg  BID

## 2013-01-20 LAB — CBC
HCT: 38 % — ABNORMAL LOW (ref 39.0–52.0)
MCHC: 34.2 g/dL (ref 30.0–36.0)
Platelets: 210 10*3/uL (ref 150–400)
RDW: 13.3 % (ref 11.5–15.5)
WBC: 10.4 10*3/uL (ref 4.0–10.5)

## 2013-01-20 NOTE — Progress Notes (Addendum)
Physical Therapy Treatment Patient Details Name: Curly Mackowski MRN: [id number] DOB: 08-Oct-1961 Today's Date: 01/20/2013 Time: 8295-6213 PT Time Calculation (min): 41 min  PT Assessment / Plan / Recommendation Comments on Treatment Session  Amb pt in halwway then assisted back to bed to pwerform TE's.  Pt plans to D/C to home today after his hospital bed is delivered.    Follow Up Recommendations  Home health PT     Does the patient have the potential to tolerate intense rehabilitation     Barriers to Discharge        Equipment Recommendations  None recommended by PT    Recommendations for Other Services    Frequency 7X/week   Plan      Precautions / Restrictions Precautions Precautions: Fall Restrictions Weight Bearing Restrictions: No Other Position/Activity Restrictions: WBAT   Pertinent Vitals/Pain C/o 5/10 L hip pain ICE applied    Mobility  Bed Mobility Bed Mobility: Sit to Supine Sit to Supine: 5: Supervision Details for Bed Mobility Assistance: pt used a belt to assist R LE up onto bed plus increased time Transfers Transfers: Sit to Stand;Stand to Sit Sit to Stand: 6: Modified independent (Device/Increase time);From chair/3-in-1 Stand to Sit: 6: Modified independent (Device/Increase time);To bed Details for Transfer Assistance: increased time  Ambulation/Gait Ambulation/Gait Assistance: 6: Modified independent (Device/Increase time) Ambulation Distance (Feet): 94 Feet Assistive device: Rolling walker Ambulation/Gait Assistance Details: increased time  Gait Pattern: Step-to pattern;Decreased stance time - right Gait velocity: decreased    Exercises Total Joint Exercises Ankle Circles/Pumps: AROM;Both;20 reps;Supine Quad Sets: AROM;Both;10 reps Heel Slides: AAROM;Right;15 reps;Supine Hip ABduction/ADduction: AAROM;Right;10 reps;Supine    PT Goals                                                           progressing    Visit Information  Last PT  Received On: 01/20/13 Assistance Needed: +1    Subjective Data      Cognition       Balance   good  End of Session PT - End of Session Equipment Utilized During Treatment: Gait belt Activity Tolerance: Patient tolerated treatment well Patient left: in bed;with call bell/phone within reach Nurse Communication: Mobility status   [name]  PTA [facility]  Acute  Rehab Pager      [phone]

## 2013-01-20 NOTE — Care Management Note (Signed)
    Page 1 of 2   01/20/2013     3:44:42 PM   CARE MANAGEMENT NOTE 01/20/2013  Patient:  Ferguson, Douglas   Account Number:  [id number]  Date Initiated:  01/18/2013  Documentation initiated by:  [facility]  Subjective/Objective Assessment:   52 year old male admitted after falling and sustaining a hip fracute.     Action/Plan:   From home, return there at d/c.   Anticipated DC Date:  01/21/2013   Anticipated DC Plan:  HOME W HOME HEALTH SERVICES      DC Planning Services  CM consult      Choice offered to / List presented to:  C-3 Spouse   DME arranged  HOSPITAL BED      DME agency  [facility]     HH arranged  HH-2 PT      [facility] agency  [facility]   Status of service:  Completed, signed off Medicare Important Message given?  NA - LOS <3 / Initial given by admissions (If response is "NO", the following Medicare IM given date fields will be blank) Date Medicare IM given:   Date Additional Medicare IM given:    Discharge Disposition:  HOME W HOME HEALTH SERVICES  Per UR Regulation:  Reviewed for med. necessity/level of care/duration of stay  If discussed at Long Length of Stay Meetings, dates discussed:    Comments:  01/20/2013 Colleen Can BSN RN CCM [phone] PT FOR DISCHARGE TODAY; [facility] IN PLACE AND WILL START HHSERVICES TOMORROW 01/21/2013.  01/19/2013 Colleen Can BSN RN CCM [phone] Cm spoke with patient and spouse. Plans are for patient to return to his home in [place] where spouse will be caregiver. He already has RW and access to commode seat. He is requesting hosp bed. List of HH agencies provided to paatient and spouse. spouse has chsen [facility] for HHpt services. [facility] rep was notifed of services request. They can provide HHpt services. [facility] DME rep also notified of request for hosp bed.

## 2013-01-20 NOTE — Progress Notes (Signed)
PATIENT ID: [name]   3 Days Post-Op Procedure(s) (LRB): INTRAMEDULLARY (IM) NAIL INTERTROCHANTRIC (Right)  Subjective: Feeling good today. Pain is minimal, located around right hip and leg. Pain under control with Norco. Has been up with PT. Hospital bed at home. Ready to go home with home health.   Objective:  Filed Vitals:   01/20/13 0510  BP: 105/66  Pulse: 58  Temp: 98.3 F (36.8 C)  Resp: 14     Awake, alert, orientated R hip dressings clean, dry, intact No surrounding erythema, warmth Wiggles toes, distally NVI  Labs:   Recent Labs  01/17/13 1125 01/18/13 0416 01/19/13 0410 01/20/13 0442  HGB 15.9 14.3 13.2 13.0   Recent Labs  01/19/13 0410 01/20/13 0442  WBC 14.7* 10.4  RBC 4.37 4.30  HCT 38.6* 38.0*  PLT 235 210   Recent Labs  01/18/13 0416 01/19/13 0410  NA 134* 139  K 3.9 3.9  CL 100 105  CO2 26 27  BUN 14 16  CREATININE 0.86 0.95  GLUCOSE 166* 117*  CALCIUM 8.6 8.3*    Assessment and Plan: 3 days p/o right IM intertrochantric nail  WBAT Can d/c home today with home health Norco for home pain control, ASA 325mg  BID, scripts in chart  VTE proph: SCDs, ASA 325mg  BID

## 2013-01-20 NOTE — Progress Notes (Signed)
Pt stable, scripts, and d/c instructions given with no questions/concerns voiced by pt or wife.  Pt transported via wheelchair to private vehicle with NT and wife. 

## 2013-01-22 DIAGNOSIS — S72109A Unspecified trochanteric fracture of unspecified femur, initial encounter for closed fracture: Secondary | ICD-10-CM

## 2013-01-22 NOTE — Discharge Summary (Signed)
Patient ID: Douglas Ferguson MRN: [id number] DOB/AGE: 53-Aug-1962 69 y.o.  Admit date: 01/17/2013 Discharge date: 01/22/2013  Admission Diagnoses:  Active Problems:   Trochanteric fracture   Discharge Diagnoses:  Same  Past Medical History  Diagnosis Date  . Shoulder pain, left     prn NSAIDS  . Varicose veins   . Migraines     Surgeries: Procedure(s): INTRAMEDULLARY (IM) NAIL INTERTROCHANTRIC on 01/17/2013   Consultants:    Discharged Condition: Improved  Hospital Course: Douglas Ferguson is an 52 y.o. male who was admitted 01/17/2013 for operative treatment of intertrochanteric proximal femur fracture. The patient fell on ice onto his right hip. He had immediate pain and inability to weight-bear. Xrays revealed intertrochanteric proximal femur fracture and surgical management was determined appropriate for treatment and early ambulation.After pre-op clearance the patient was taken to the operating room on 01/17/2013 and underwent  Procedure(s): INTRAMEDULLARY (IM) NAIL INTERTROCHANTRIC.    Patient was given perioperative antibiotics: Anti-infectives   Start     Dose/Rate Route Frequency Ordered Stop   01/18/13 0600  ceFAZolin (ANCEF) IVPB 2 g/50 mL premix  Status:  Discontinued     2 g 100 mL/hr over 30 Minutes Intravenous On call to O.R. 01/17/13 1809 01/17/13 1816   01/17/13 2200  ceFAZolin (ANCEF) IVPB 2 g/50 mL premix     2 g 100 mL/hr over 30 Minutes Intravenous Every 6 hours 01/17/13 1805 01/18/13 0450       Patient was given sequential compression devices, early ambulation, and ASA 325mg  BID to prevent DVT.  Patient benefited maximally from hospital stay and there were no complications.    Recent vital signs: No data found.    Recent laboratory studies:  Recent Labs  01/20/13 0442  WBC 10.4  HGB 13.0  HCT 38.0*  PLT 210     Discharge Medications:     Medication List    STOP taking these medications       ibuprofen 200 MG tablet  Commonly known as:   ADVIL,MOTRIN      TAKE these medications       aspirin 325 MG EC tablet  Take 1 tablet (325 mg total) by mouth 2 (two) times daily.     HYDROcodone-acetaminophen 5-325 MG per tablet  Commonly known as:  NORCO/VICODIN  Take 1-2 tablets by mouth every 4 (four) hours as needed.        Diagnostic Studies: Dg Hip 1 View Right  01/17/2013  *RADIOLOGY REPORT*  Clinical Data: Fracture fixation.  DG C-ARM 1-60 MIN - NRPT [facility], RIGHT HIP - 1 VIEW  Comparison: Plain films right hip earlier this same date.  Findings: Four fluoroscopic spot views of the right hip demonstrate placement of a dynamic hip screw and short IM nail with a single distal interlocking screw for fixation of a right intertrochanteric fracture.  Position and alignment appear near anatomic.  Hardware is intact.  IMPRESSION: ORIF right intertrochanteric fracture.   Original Report Authenticated By: Holley Dexter, M.D.    Dg Hip Complete Right  01/17/2013  *RADIOLOGY REPORT*  Clinical Data: Fall, right hip pain  RIGHT HIP - COMPLETE 2+ VIEW  Comparison: None.  Findings: Nondisplaced intertrochanteric right hip fracture.  The bilateral joint spaces are preserved.  Visualized bony pelvis appears intact.  IMPRESSION: Nondisplaced intertrochanteric right hip fracture.   Original Report Authenticated By: Charline Bills, M.D.    Dg Pelvis Portable  01/17/2013  *RADIOLOGY REPORT*  Clinical Data: Fracture fixation  PORTABLE PELVIS  Comparison: 02/06/2013  Findings: Intertrochanteric fracture on the right has been fixed with a threaded screw and medullary nail.  Fracture alignment is satisfactory.  Right hip joint appears normal.  IMPRESSION: Satisfactory surgical fixation of intertrochanteric fracture on the right which is in anatomic alignment.   Original Report Authenticated By: Janeece Riggers, M.D.    Dg C-arm 1-60 Min-no Report  01/17/2013  *RADIOLOGY REPORT*  Clinical Data: Fracture fixation.  DG C-ARM 1-60 MIN - NRPT [facility], RIGHT HIP - 1  VIEW  Comparison: Plain films right hip earlier this same date.  Findings: Four fluoroscopic spot views of the right hip demonstrate placement of a dynamic hip screw and short IM nail with a single distal interlocking screw for fixation of a right intertrochanteric fracture.  Position and alignment appear near anatomic.  Hardware is intact.  IMPRESSION: ORIF right intertrochanteric fracture.   Original Report Authenticated By: Holley Dexter, M.D.     Disposition: 06-Home-Health Care Svc      Discharge Orders   Future Orders Complete By Expires     Call MD / Call 911  As directed     Comments:      If you experience chest pain or shortness of breath, CALL 911 and be transported to the hospital emergency room.  If you develope a fever above 101 F, pus (white drainage) or increased drainage or redness at the wound, or calf pain, call your surgeon's office.    Call MD / Call 911  As directed     Comments:      If you experience chest pain or shortness of breath, CALL 911 and be transported to the hospital emergency room.  If you develope a fever above 101 F, pus (white drainage) or increased drainage or redness at the wound, or calf pain, call your surgeon's office.    Constipation Prevention  As directed     Comments:      Drink plenty of fluids.  Prune juice may be helpful.  You may use a stool softener, such as Colace (over the counter) 100 mg twice a day.  Use MiraLax (over the counter) for constipation as needed.    Constipation Prevention  As directed     Comments:      Drink plenty of fluids.  Prune juice may be helpful.  You may use a stool softener, such as Colace (over the counter) 100 mg twice a day.  Use MiraLax (over the counter) for constipation as needed.    Diet - low sodium heart healthy  As directed     Diet - low sodium heart healthy  As directed     Driving restrictions  As directed     Comments:      No driving until cleared by physician.    Driving restrictions  As  directed     Comments:      No driving until cleared by physician.    Increase activity slowly as tolerated  As directed     Increase activity slowly as tolerated  As directed     Weight bearing as tolerated  As directed        Follow-up Information   Follow up with Mable Paris, MD. Schedule an appointment as soon as possible for a visit in 2 weeks.   Contact information:   [address] [place] [address] [phone]        Signed: Jiles Harold 01/22/2013, 10:43 AM

## 2013-09-17 ENCOUNTER — Other Ambulatory Visit: Payer: Self-pay

## 2014-09-29 ENCOUNTER — Ambulatory Visit (INDEPENDENT_AMBULATORY_CARE_PROVIDER_SITE_OTHER): Payer: BC Managed Care – PPO | Admitting: Internal Medicine

## 2014-09-29 ENCOUNTER — Encounter: Payer: Self-pay | Admitting: Internal Medicine

## 2014-09-29 ENCOUNTER — Ambulatory Visit (INDEPENDENT_AMBULATORY_CARE_PROVIDER_SITE_OTHER): Payer: BC Managed Care – PPO

## 2014-09-29 VITALS — BP 116/70 | HR 61 | Temp 98.3°F | Resp 12 | Ht 73.0 in | Wt 255.2 lb

## 2014-09-29 DIAGNOSIS — Z0000 Encounter for general adult medical examination without abnormal findings: Secondary | ICD-10-CM

## 2014-09-29 DIAGNOSIS — Z87898 Personal history of other specified conditions: Secondary | ICD-10-CM

## 2014-09-29 DIAGNOSIS — K200 Eosinophilic esophagitis: Secondary | ICD-10-CM

## 2014-09-29 DIAGNOSIS — Z8669 Personal history of other diseases of the nervous system and sense organs: Secondary | ICD-10-CM | POA: Insufficient documentation

## 2014-09-29 DIAGNOSIS — Z0189 Encounter for other specified special examinations: Secondary | ICD-10-CM

## 2014-09-29 DIAGNOSIS — Z23 Encounter for immunization: Secondary | ICD-10-CM

## 2014-09-29 DIAGNOSIS — Z8489 Family history of other specified conditions: Secondary | ICD-10-CM

## 2014-09-29 NOTE — Patient Instructions (Signed)
Your next office appointment will be determined based upon review of your pending labs. Those instructions will be transmitted to you through My Chart  . The Pulmonary  referral will be scheduled and you'll be notified of the time.Please call the Referral Co-Ordinator @ [phone] if you have not been notified of appointment time within 7-10 days.

## 2014-09-29 NOTE — Progress Notes (Signed)
   Subjective:    Patient ID: Douglas Ferguson, male    DOB: 02-11-1961, 53 y.o.   MRN: [id number]  HPI  He is here for a physical;acute issues include snoring.  In the last year he's had a 10# weight gain; with this he has begun snoring. His wife does not describe frank apnea.  He also has a history of eosinophilic esophagitis. He has no active symptoms at this time simply by eating slowly, chewing well, and taking fluids with his meals.     Review of Systems Unexplained weight loss, abdominal pain, significant dyspepsia, dysphagia, melena, rectal bleeding, or persistently small caliber stools are denied.     Objective:   Physical Exam  Gen.: Healthy and well-nourished in appearance. Alert, appropriate and cooperative throughout exam. Head: Normocephalic without obvious abnormalities;  Pattern alopecia  Eyes: No corneal or conjunctival inflammation noted. Pupils equal round reactive to light and accommodation. Extraocular motion intact. Ears: External  ear exam reveals no significant lesions or deformities. Canals clear .TMs normal. Hearing is grossly normal bilaterally. Nose: External nasal exam reveals no deformity or inflammation. Nasal mucosa are pink and moist. No lesions or exudates noted.  Septum deviated to R Mouth: Oral mucosa and oropharynx reveal no lesions or exudates but oropharynx crowded. Teeth in good repair. Neck: No deformities, masses, or tenderness noted. Range of motion &. Thyroid normal Lungs: Normal respiratory effort; chest expands symmetrically. Lungs are clear to auscultation without rales, wheezes, or increased work of breathing. Heart: Normal rate and rhythm. Normal S1 and S2. No gallop, click, or rub. No murmur. Abdomen: Protuberant.Bowel sounds normal; abdomen soft and nontender. No masses, organomegaly or hernias noted. Genitalia: Genitalia normal except for left varices. Prostate is normal without enlargement, asymmetry, nodularity, or induration     Musculoskeletal/extremities: No deformity or scoliosis noted of  the thoracic or lumbar spine.  No clubbing, cyanosis, edema, or significant extremity  deformity noted. Range of motion normal .Tone & strength normal. Hand joints normal Fingernail  health good. Able to lie down & sit up w/o help. Negative SLR bilaterally Vascular: Carotid, radial artery, dorsalis pedis and  posterior tibial pulses are full and equal. No bruits present. Neurologic: Alert and oriented x3. Deep tendon reflexes symmetrical and normal.  Gait normal .       Skin: Intact without suspicious lesions or rashes. Lymph: No cervical, axillary, or inguinal lymphadenopathy present. Psych: Mood and affect are normal. Normally interactive                                                                                        Assessment & Plan:  #1 comprehensive physical exam; no acute findings  Plan: see Orders  & Recommendations

## 2014-09-29 NOTE — Progress Notes (Signed)
Pre visit review using our clinic review tool, if applicable. No additional management support is needed unless otherwise documented below in the visit note. 

## 2014-10-19 ENCOUNTER — Encounter: Payer: Self-pay | Admitting: Internal Medicine

## 2014-11-13 IMAGING — RF DG HIP 1V*R*
1 series · 4 of 4 positions shown · non-contrast
Comparison: Plain films right hip earlier this same date.

CLINICAL DATA: Fracture fixation.

DG C-ARM 1-60 MIN - NRPT [facility], RIGHT HIP - 1 VIEW

[Series 1: run · 4 of 4 slices shown]
[im 1/4]
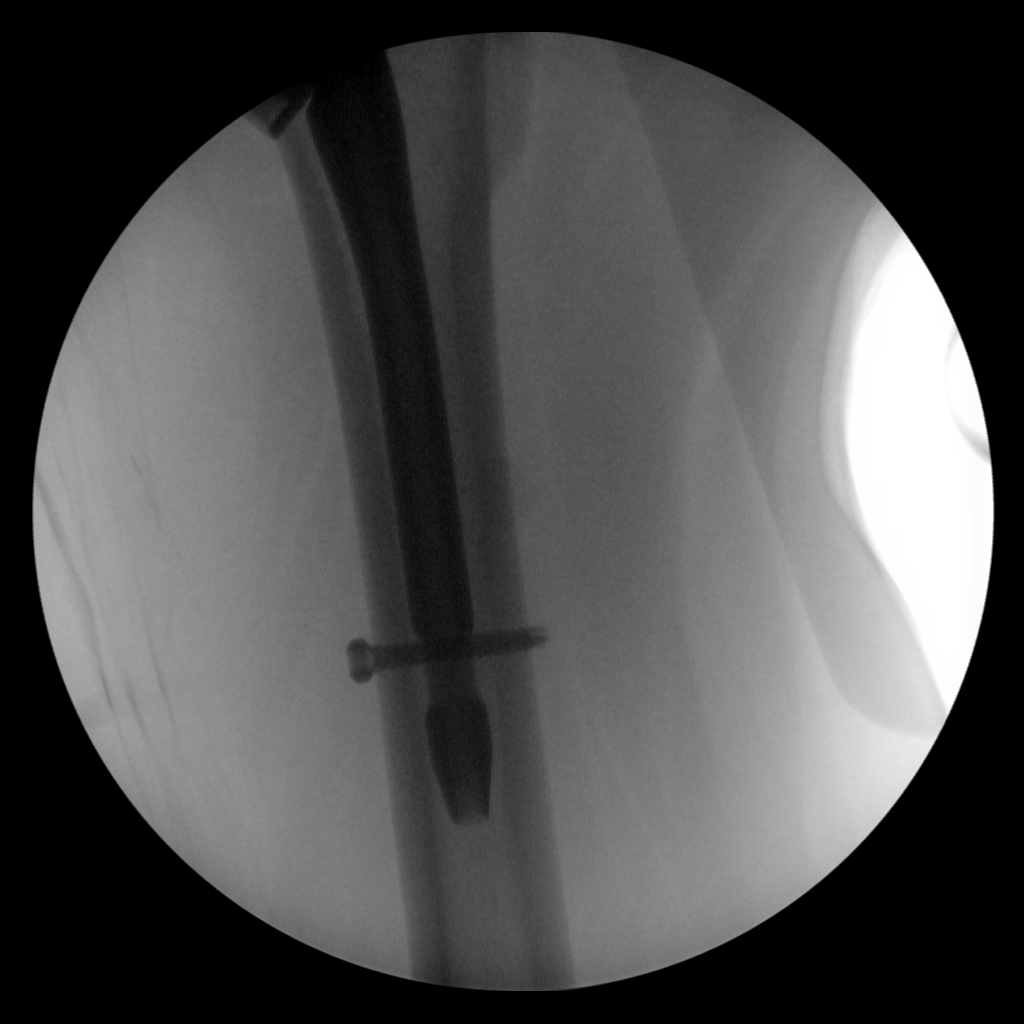
[im 2/4]
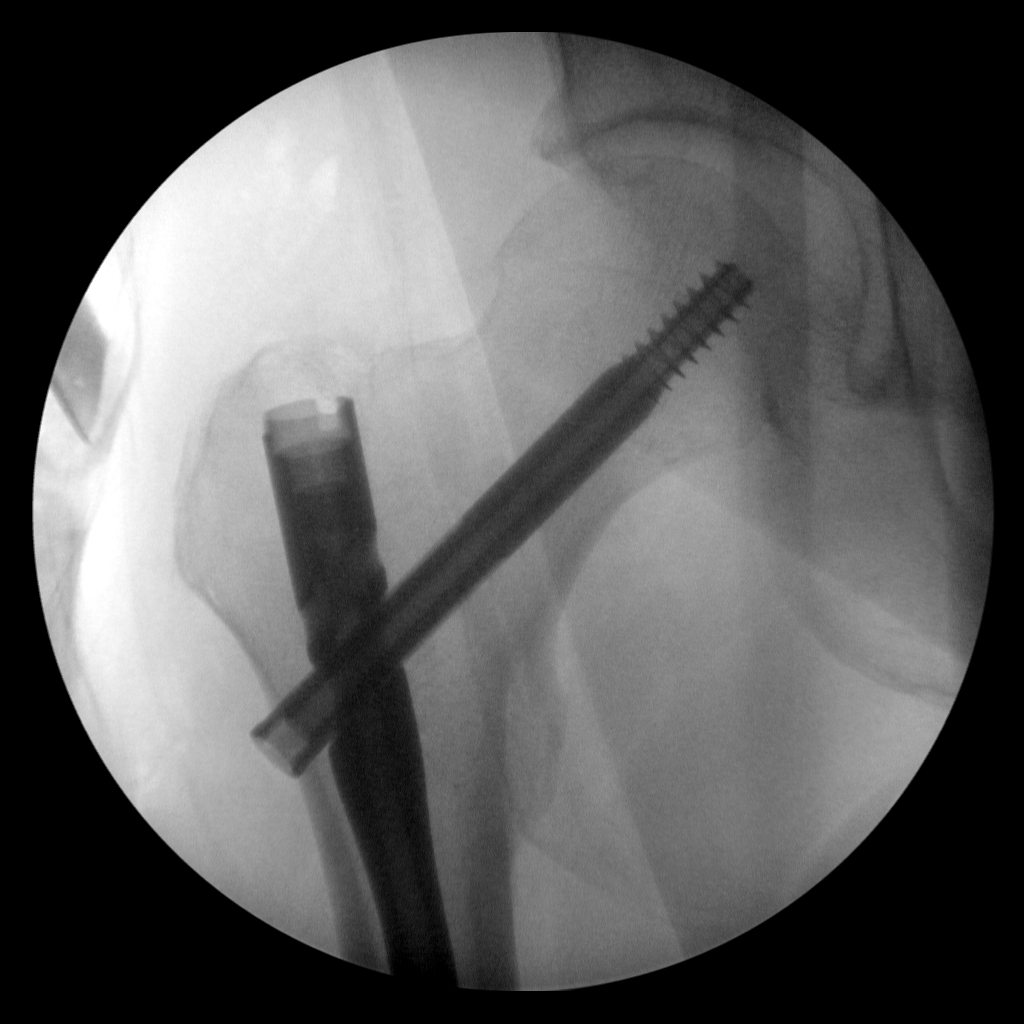
[im 3/4]
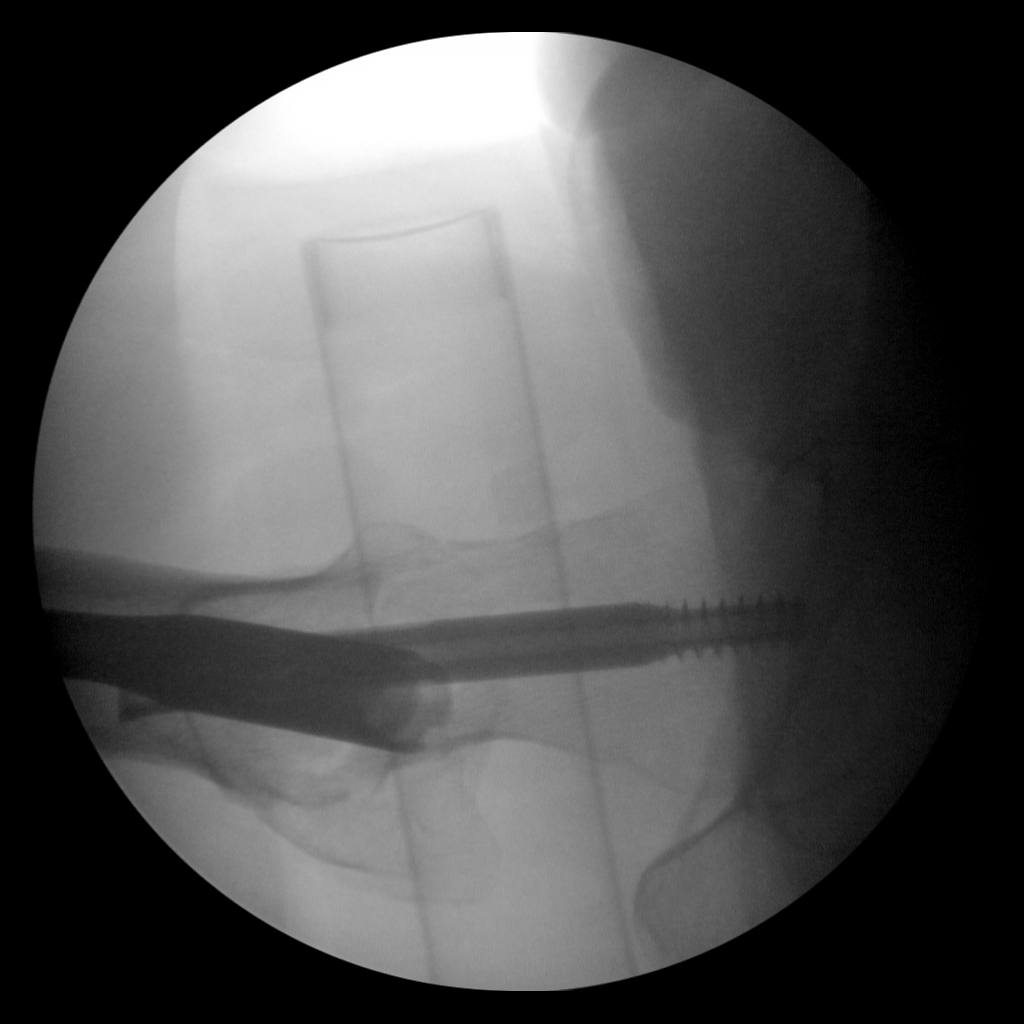
[im 4/4]
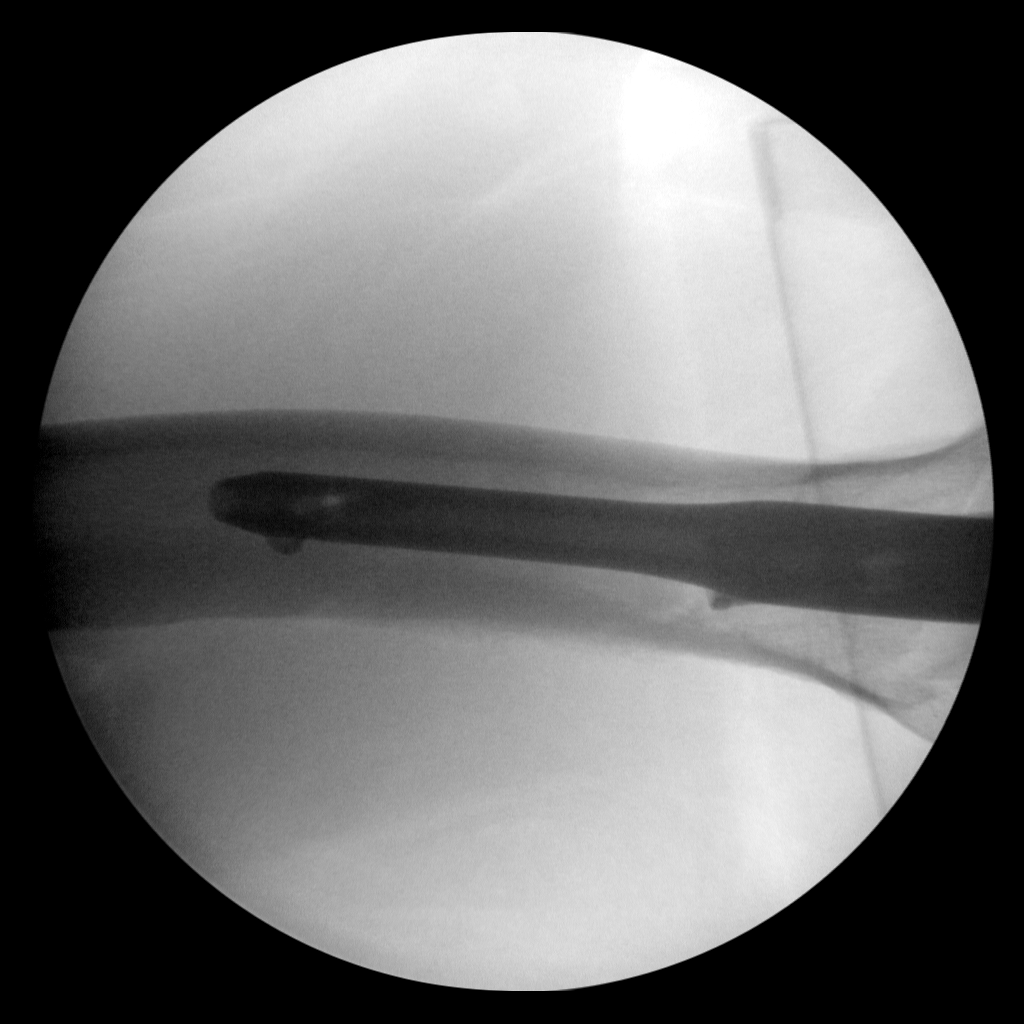

[4 of 4 positions shown; findings below may reference images not displayed]

FINDINGS: Four fluoroscopic spot views of the right hip demonstrate
placement of a dynamic hip screw and short IM nail with a single
distal interlocking screw for fixation of a right intertrochanteric
fracture.  Position and alignment appear near anatomic.  Hardware
is intact.
IMPRESSION: ORIF right intertrochanteric fracture.

## 2014-11-13 IMAGING — CR DG PORTABLE PELVIS
1 series · 2 of 2 positions shown · non-contrast
Comparison: 02/06/2013

CLINICAL DATA: Fracture fixation

PORTABLE PELVIS

[Series 1: AP · U · 2 of 2 slices shown]
[im 1/2]
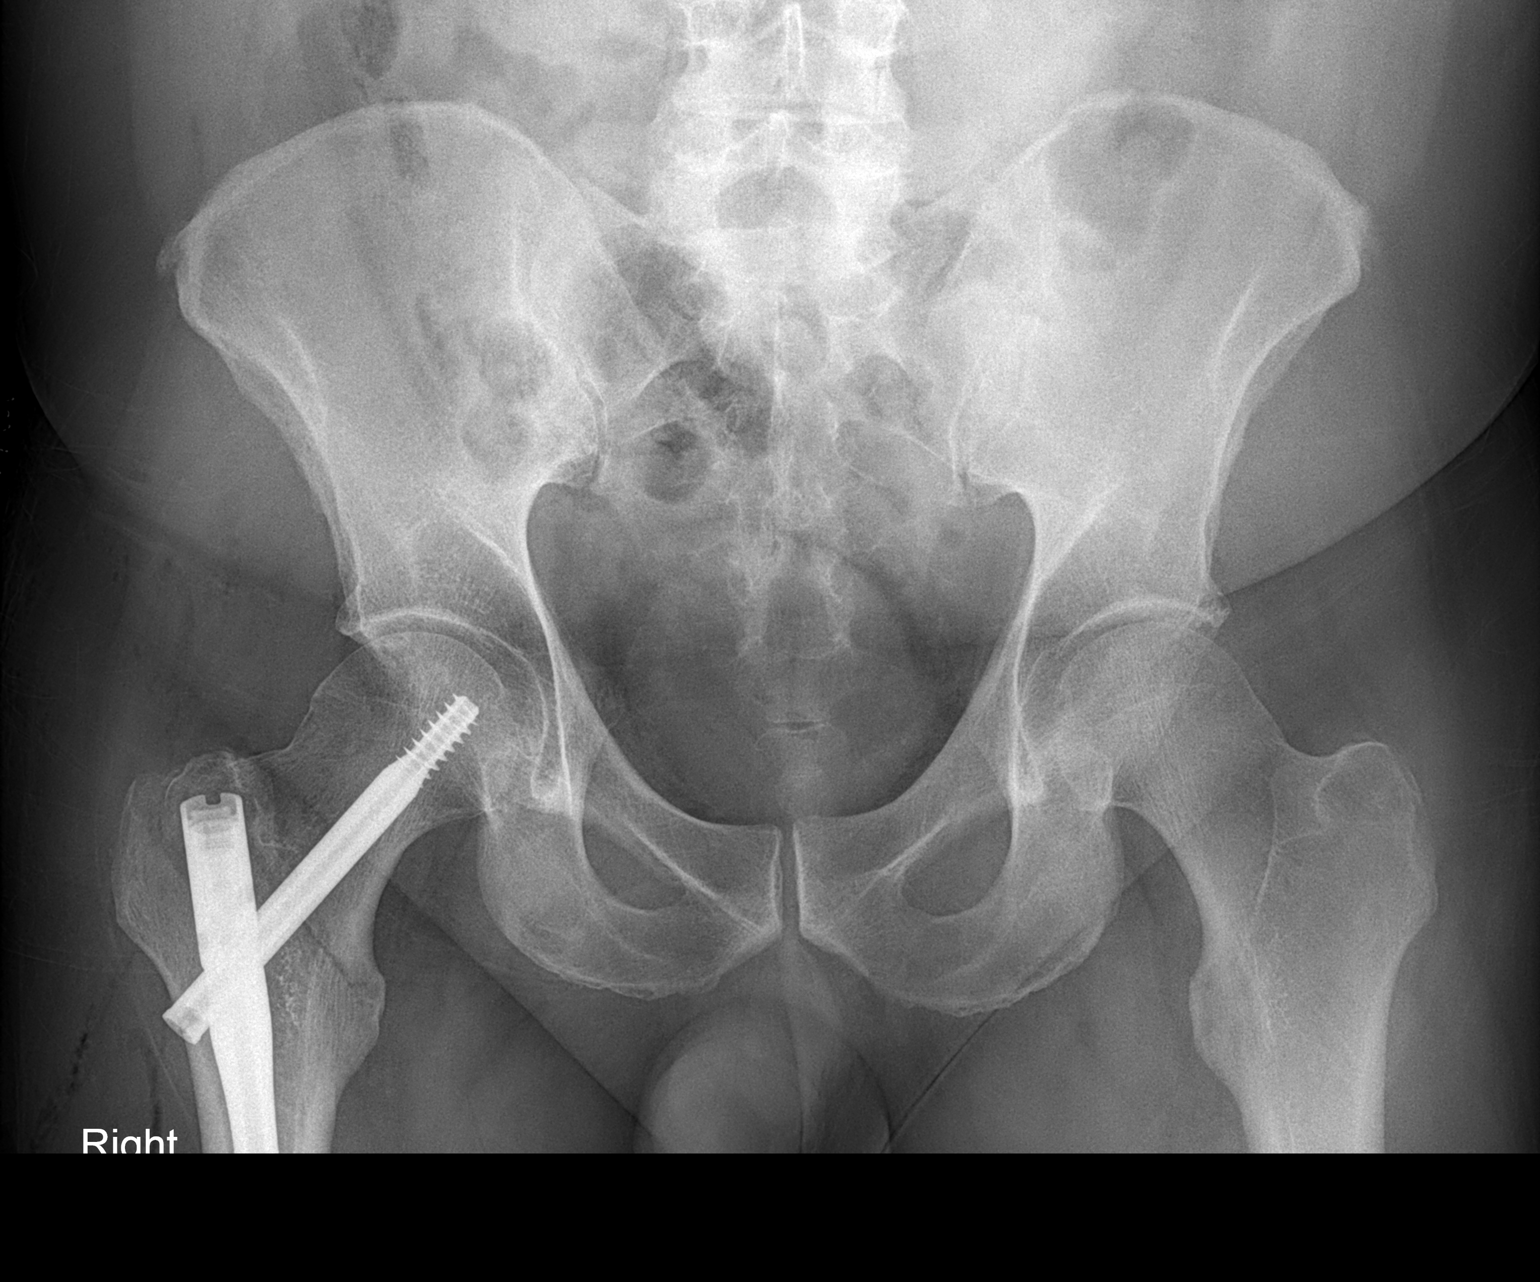
[im 2/2]
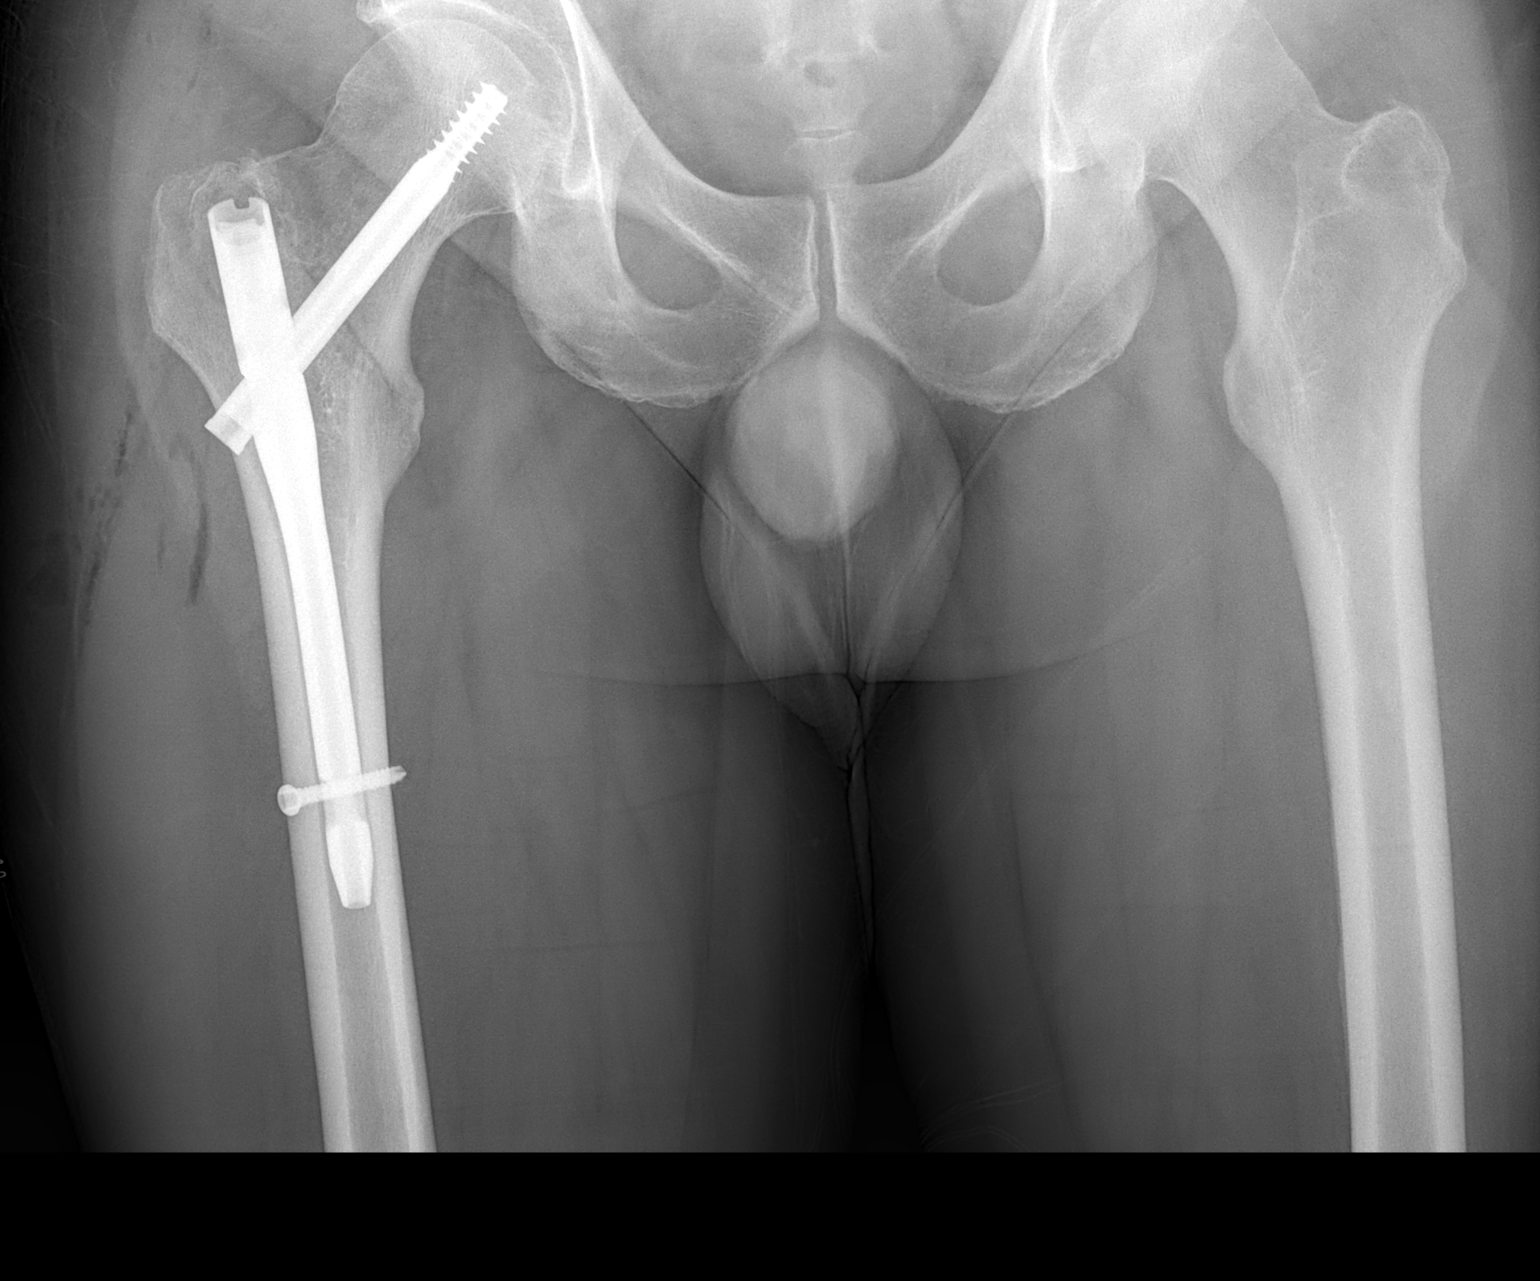

[2 of 2 positions shown; findings below may reference images not displayed]

FINDINGS: Intertrochanteric fracture on the right has been fixed
with a threaded screw and medullary nail.  Fracture alignment is
satisfactory.  Right hip joint appears normal.
IMPRESSION: Satisfactory surgical fixation of intertrochanteric fracture on the
right which is in anatomic alignment.

## 2014-12-03 ENCOUNTER — Institutional Professional Consult (permissible substitution): Payer: BC Managed Care – PPO | Admitting: Pulmonary Disease

## 2015-01-03 ENCOUNTER — Encounter: Payer: Self-pay | Admitting: Pulmonary Disease

## 2015-01-03 ENCOUNTER — Ambulatory Visit (INDEPENDENT_AMBULATORY_CARE_PROVIDER_SITE_OTHER): Payer: BLUE CROSS/BLUE SHIELD | Admitting: Pulmonary Disease

## 2015-01-03 VITALS — BP 140/82 | HR 56 | Temp 97.1°F | Ht 73.0 in | Wt 251.8 lb

## 2015-01-03 DIAGNOSIS — R0683 Snoring: Secondary | ICD-10-CM | POA: Insufficient documentation

## 2015-01-03 DIAGNOSIS — G4733 Obstructive sleep apnea (adult) (pediatric): Secondary | ICD-10-CM

## 2015-01-03 NOTE — Assessment & Plan Note (Signed)
The patient has a history of loud snoring and frequent awakenings at night with nonrestorative sleep, however it is not overly symptomatic during the day with respect to sleepiness. He is overweight with a large neck size, and therefore at significant risk for obstructive sleep apnea. His wife is very concerned about his sleeping at night, and wishes to put to rest the question of possible obstructive sleep apnea. I will arrange for a home sleep test, and see the patient back for review. In the meantime, I've asked him to work aggressively on weight loss.

## 2015-01-03 NOTE — Patient Instructions (Signed)
Will schedule for home sleep testing, and will arrange for followup once the results are available. Work on weight reduction.  

## 2015-01-03 NOTE — Progress Notes (Signed)
Subjective:    Patient ID: Douglas Ferguson, male    DOB: 1961/08/24, 54 y.o.   MRN: [id number]  HPI The patient is a 54 year old male who I've been asked to see for possible obstructive sleep apnea. He is been noted to have loud snoring by his wife, but she has not mentioned specifically an abnormal breathing pattern during sleep. The patient can awaken is many as 3 times a night, and is unrested in the mornings at least 50% of the time. Despite this, he feels that his alertness is adequate during the day, but he will have the late afternoon lull that does not interfere with his work efficiency. He has no issues with sleepiness in the evenings watching television or movies, and no sleepiness with driving. He states that his weight is unchanged over the last 2 years, and his Epworth score today is 3.   Sleep Questionnaire What time do you typically go to bed?( Between what hours) 10-11p 10-11p at 1402 on 01/03/15 by Tommie SamsMindy S Silva, CMA How long does it take you to fall asleep? 0-30 min 0-30 min at 1402 on 01/03/15 by Tommie SamsMindy S Silva, CMA How many times during the night do you wake up? 1 1 at 1402 on 01/03/15 by Tommie SamsMindy S Silva, CMA What time do you get out of bed to start your day? 0530 0530 at 1402 on 01/03/15 by Tommie SamsMindy S Silva, CMA Do you drive or operate heavy machinery in your occupation? How much has your weight changed (up or down) over the past two years? (In pounds) 0 oz (0 kg) 0 oz (0 kg) at 1402 on 01/03/15 by Tommie SamsMindy S Silva, CMA Have you ever had a sleep study before? No No at 1402 on 01/03/15 by Tommie SamsMindy S Silva, CMA Do you currently use CPAP? No No at 1402 on 01/03/15 by Tommie SamsMindy S Silva, CMA Do you wear oxygen at any time? No No at 1402 on 01/03/15 by Tommie SamsMindy S Silva, CMA   Review of Systems  Constitutional: Negative for fever and unexpected weight change.  HENT: Negative for congestion, dental problem, ear pain, nosebleeds, postnasal drip, rhinorrhea, sinus pressure, sneezing,  sore throat and trouble swallowing.   Eyes: Negative for redness and itching.  Respiratory: Negative for cough, chest tightness, shortness of breath and wheezing.   Cardiovascular: Negative for palpitations and leg swelling.  Gastrointestinal: Negative for nausea and vomiting.  Genitourinary: Negative for dysuria.  Musculoskeletal: Negative for joint swelling.  Skin: Negative for rash.  Neurological: Negative for headaches.  Hematological: Does not bruise/bleed easily.  Psychiatric/Behavioral: Negative for dysphoric mood. The patient is not nervous/anxious.        Objective:   Physical Exam  Constitutional:  Obese male, no acute distress  HENT:  Nares patent without discharge, large turbinates noted.  Oropharynx without exudate, palate and uvula are moderately elongated  Eyes:  Perrla, eomi, no scleral icterus  Neck:  No JVD, no TMG  Cardiovascular:  Normal rate, regular rhythm, no rubs or gallops.  No murmurs        Intact distal pulses  Pulmonary :  Normal breath sounds, no stridor or respiratory distress   No rales, rhonchi, or wheezing  Abdominal:  Soft, nondistended, bowel sounds present.  No tenderness noted.   Musculoskeletal:  No lower extremity edema noted.  Lymph Nodes:  No cervical lymphadenopathy noted  Skin:  No cyanosis noted  Neurologic:  Alert, appropriate, moves all 4 extremities without obvious deficit.  Assessment & Plan:

## 2015-01-19 ENCOUNTER — Other Ambulatory Visit: Payer: Self-pay | Admitting: Pulmonary Disease

## 2015-01-19 DIAGNOSIS — G4733 Obstructive sleep apnea (adult) (pediatric): Secondary | ICD-10-CM

## 2015-03-29 ENCOUNTER — Ambulatory Visit (HOSPITAL_BASED_OUTPATIENT_CLINIC_OR_DEPARTMENT_OTHER): Payer: BLUE CROSS/BLUE SHIELD | Attending: Pulmonary Disease | Admitting: Radiology

## 2015-03-29 VITALS — Ht 73.0 in | Wt 242.0 lb

## 2015-03-29 DIAGNOSIS — G4730 Sleep apnea, unspecified: Secondary | ICD-10-CM | POA: Insufficient documentation

## 2015-03-29 DIAGNOSIS — G4710 Hypersomnia, unspecified: Secondary | ICD-10-CM | POA: Insufficient documentation

## 2015-03-29 DIAGNOSIS — R0683 Snoring: Secondary | ICD-10-CM | POA: Insufficient documentation

## 2015-03-29 DIAGNOSIS — G4733 Obstructive sleep apnea (adult) (pediatric): Secondary | ICD-10-CM

## 2015-04-07 ENCOUNTER — Telehealth: Payer: Self-pay | Admitting: Pulmonary Disease

## 2015-04-07 DIAGNOSIS — G4733 Obstructive sleep apnea (adult) (pediatric): Secondary | ICD-10-CM | POA: Diagnosis not present

## 2015-04-07 NOTE — Sleep Study (Signed)
   NAME: Douglas Ferguson DATE OF BIRTH:  [birth year] MEDICAL RECORD NUMBER [name]  LOCATION: [facility]  PHYSICIAN: Barbaraann ShareCLANCE,KEITH M  DATE OF STUDY: 03/29/2015  SLEEP STUDY TYPE: Nocturnal Polysomnogram               REFERRING PHYSICIAN: Clance, Maree KrabbeKeith M, MD  INDICATION FOR STUDY: Hypersomnia with sleep apnea  EPWORTH SLEEPINESS SCORE:  3 HEIGHT: 6\' 1"  (185.4 cm)  WEIGHT: 242 lb (109.77 kg)    Body mass index is 31.93 kg/(m^2).  NECK SIZE: 17.5 in.  MEDICATIONS: Reviewed in the sleep record  SLEEP ARCHITECTURE: The patient had a total sleep time of 334 minutes with no slow-wave sleep and only 52 minutes of REM. Sleep onset latency was normal at 30 minutes, and REM onset was normal at 103 minutes. Sleep efficiency was moderately reduced at 77%.  RESPIRATORY DATA: The patient was found to have 1 apnea and 8 obstructive hypopneas, giving him an AHI of only 2 events per hour. The events occurred in all body positions, and there was moderate snoring noted throughout.  OXYGEN DATA: There was oxygen desaturation as low as 89% with the patient's obstructive events  CARDIAC DATA: No clinically significant arrhythmias were seen  MOVEMENT/PARASOMNIA: The patient had 298 periodic limb movements noted, with 7 per hour resulting in arousal or awakening.  IMPRESSION/ RECOMMENDATION:    1) small numbers of obstructive events which do not meet the AHI criteria for the obstructive sleep apnea syndrome. The patient did have moderate snoring, and should work on weight reduction.  2) very large numbers of periodic limb movements with significant sleep disruption. 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 Clinical correlation is suggested.    Barbaraann ShareLANCE,KEITH M Diplomate, American Board of Sleep Medicine  ELECTRONICALLY SIGNED ON:  04/07/2015, 9:37 AM [facility] PH: [phone]   FX: [phone] ACCREDITED BY THE AMERICAN ACADEMY OF  SLEEP MEDICINE

## 2015-04-07 NOTE — Telephone Encounter (Signed)
Needs ov to review sleep study.  Thanks.  

## 2015-04-07 NOTE — Telephone Encounter (Signed)
Called pt and [facility]

## 2015-04-08 ENCOUNTER — Ambulatory Visit (INDEPENDENT_AMBULATORY_CARE_PROVIDER_SITE_OTHER): Payer: BLUE CROSS/BLUE SHIELD | Admitting: Pulmonary Disease

## 2015-04-08 ENCOUNTER — Encounter: Payer: Self-pay | Admitting: Pulmonary Disease

## 2015-04-08 VITALS — BP 138/66 | HR 60 | Temp 98.4°F | Ht 73.0 in | Wt 247.0 lb

## 2015-04-08 DIAGNOSIS — R0683 Snoring: Secondary | ICD-10-CM | POA: Diagnosis not present

## 2015-04-08 NOTE — Assessment & Plan Note (Signed)
The patient does not have obstructive sleep apnea by his recent study, did have large numbers of limb movements with significant sleep impact. He rarely has RLS symptoms, and does not feel kicking is an issue during the night while asleep. He tells me that he is currently satisfied overall with his sleep and daytime alertness, but will call if his symptoms change or worsen. I've encouraged him to work aggressively on weight loss.

## 2015-04-08 NOTE — Progress Notes (Signed)
   Subjective:    Patient ID: Douglas Ferguson, male    DOB: 11-Jul-1961, 54 y.o.   MRN: [id number]  HPI The patient comes in today for follow-up of his recent sleep study. He was found to have an AHI of only 2 events per hour, but did have significant snoring. He was also noted to have large numbers of periodic limb movements, with what appears to be significant sleep disruption. I have reviewed the study with him in detail, and answered all of his questions.   Review of Systems  Constitutional: Negative for fever and unexpected weight change.  HENT: Negative for congestion, dental problem, ear pain, nosebleeds, postnasal drip, rhinorrhea, sinus pressure, sneezing, sore throat and trouble swallowing.   Eyes: Negative for redness and itching.  Respiratory: Negative for cough, chest tightness, shortness of breath and wheezing.   Cardiovascular: Negative for palpitations and leg swelling.  Gastrointestinal: Negative for nausea and vomiting.  Genitourinary: Negative for dysuria.  Musculoskeletal: Negative for joint swelling.  Skin: Negative for rash.  Neurological: Negative for headaches.  Hematological: Does not bruise/bleed easily.  Psychiatric/Behavioral: Negative for dysphoric mood. The patient is not nervous/anxious.        Objective:   Physical Exam Overweight male in no acute distress Nose without purulence or discharge noted Neck without lymphadenopathy or thyromegaly Lower extremities without edema, no cyanosis Alert and oriented, moves all 4 extremities.       Assessment & Plan:

## 2015-04-08 NOTE — Patient Instructions (Signed)
You do not have sleep apnea, but do have significant snoring. You had a lot of leg jerks during the sleep study, but would not worry about them if you are asymptomatic.  followup if needed.

## 2016-01-30 ENCOUNTER — Encounter: Payer: Self-pay | Admitting: Family

## 2016-01-30 ENCOUNTER — Ambulatory Visit (INDEPENDENT_AMBULATORY_CARE_PROVIDER_SITE_OTHER): Payer: BLUE CROSS/BLUE SHIELD | Admitting: Family

## 2016-01-30 VITALS — BP 126/62 | HR 56 | Temp 98.3°F | Resp 14 | Ht 73.0 in | Wt 254.4 lb

## 2016-01-30 DIAGNOSIS — Z0000 Encounter for general adult medical examination without abnormal findings: Secondary | ICD-10-CM | POA: Diagnosis not present

## 2016-01-30 NOTE — Patient Instructions (Signed)
Thank you for choosing [facility].  Summary/Instructions:  Health Maintenance, Male A healthy lifestyle and preventative care can promote health and wellness.  Maintain regular health, dental, and eye exams.  Eat a healthy diet. Foods like vegetables, fruits, whole grains, low-fat dairy products, and lean protein foods contain the nutrients you need and are low in calories. Decrease your intake of foods high in solid fats, added sugars, and salt. Get information about a proper diet from your health care provider, if necessary.  Regular physical exercise is one of the most important things you can do for your health. Most adults should get at least 150 minutes of moderate-intensity exercise (any activity that increases your heart rate and causes you to sweat) each week. In addition, most adults need muscle-strengthening exercises on 2 or more days a week.   Maintain a healthy weight. The body mass index (BMI) is a screening tool to identify possible weight problems. It provides an estimate of body fat based on height and weight. Your health care provider can find your BMI and can help you achieve or maintain a healthy weight. For males 20 years and older:  A BMI below 18.5 is considered underweight.  A BMI of 18.5 to 24.9 is normal.  A BMI of 25 to 29.9 is considered overweight.  A BMI of 30 and above is considered obese.  Maintain normal blood lipids and cholesterol by exercising and minimizing your intake of saturated fat. Eat a balanced diet with plenty of fruits and vegetables. Blood tests for lipids and cholesterol should begin at age 20 and be repeated every 5 years. If your lipid or cholesterol levels are high, you are over age 50, or you are at high risk for heart disease, you may need your cholesterol levels checked more frequently.Ongoing high lipid and cholesterol levels should be treated with medicines if diet and exercise are not working.  If you smoke, find out from  your health care provider how to quit. If you do not use tobacco, do not start.  Lung cancer screening is recommended for adults aged 55-80 years who are at high risk for developing lung cancer because of a history of smoking. A yearly low-dose CT scan of the lungs is recommended for people who have at least a 30-pack-year history of smoking and are current smokers or have quit within the past 15 years. A pack year of smoking is smoking an average of 1 pack of cigarettes a day for 1 year (for example, a 30-pack-year history of smoking could mean smoking 1 pack a day for 30 years or 2 packs a day for 15 years). Yearly screening should continue until the smoker has stopped smoking for at least 15 years. Yearly screening should be stopped for people who develop a health problem that would prevent them from having lung cancer treatment.  If you choose to drink alcohol, do not have more than 2 drinks per day. One drink is considered to be 12 oz (360 mL) of beer, 5 oz (150 mL) of wine, or 1.5 oz (45 mL) of liquor.  Avoid the use of street drugs. Do not share needles with anyone. Ask for help if you need support or instructions about stopping the use of drugs.  High blood pressure causes heart disease and increases the risk of stroke. High blood pressure is more likely to develop in:  People who have blood pressure in the end of the normal range (100-139/85-89 mm Hg).  People who are overweight   or obese.  People who are African American.  If you are 18-39 years of age, have your blood pressure checked every 3-5 years. If you are 40 years of age or older, have your blood pressure checked every year. You should have your blood pressure measured twice--once when you are at a hospital or clinic, and once when you are not at a hospital or clinic. Record the average of the two measurements. To check your blood pressure when you are not at a hospital or clinic, you can use:  An automated blood pressure machine  at a pharmacy.  A home blood pressure monitor.  If you are 45-79 years old, ask your health care provider if you should take aspirin to prevent heart disease.  Diabetes screening involves taking a blood sample to check your fasting blood sugar level. This should be done once every 3 years after age 45 if you are at a normal weight and without risk factors for diabetes. Testing should be considered at a younger age or be carried out more frequently if you are overweight and have at least 1 risk factor for diabetes.  Colorectal cancer can be detected and often prevented. Most routine colorectal cancer screening begins at the age of 50 and continues through age 75. However, your health care provider may recommend screening at an earlier age if you have risk factors for colon cancer. On a yearly basis, your health care provider may provide home test kits to check for hidden blood in the stool. A small camera at the end of a tube may be used to directly examine the colon (sigmoidoscopy or colonoscopy) to detect the earliest forms of colorectal cancer. Talk to your health care provider about this at age 50 when routine screening begins. A direct exam of the colon should be repeated every 5-10 years through age 75, unless early forms of precancerous polyps or small growths are found.  People who are at an increased risk for hepatitis B should be screened for this virus. You are considered at high risk for hepatitis B if:  You were born in a country where hepatitis B occurs often. Talk with your health care provider about which countries are considered high risk.  Your parents were born in a high-risk country and you have not received a shot to protect against hepatitis B (hepatitis B vaccine).  You have HIV or AIDS.  You use needles to inject street drugs.  You live with, or have sex with, someone who has hepatitis B.  You are a man who has sex with other men (MSM).  You get hemodialysis  treatment.  You take certain medicines for conditions like cancer, organ transplantation, and autoimmune conditions.  Hepatitis C blood testing is recommended for all people born from 1945 through 1965 and any individual with known risk factors for hepatitis C.  Healthy men should no longer receive prostate-specific antigen (PSA) blood tests as part of routine cancer screening. Talk to your health care provider about prostate cancer screening.  Testicular cancer screening is not recommended for adolescents or adult males who have no symptoms. Screening includes self-exam, a health care provider exam, and other screening tests. Consult with your health care provider about any symptoms you have or any concerns you have about testicular cancer.  Practice safe sex. Use condoms and avoid high-risk sexual practices to reduce the spread of sexually transmitted infections (STIs).  You should be screened for STIs, including gonorrhea and chlamydia if:  You are sexually   active and are younger than 24 years.  You are older than 24 years, and your health care provider tells you that you are at risk for this type of infection.  Your sexual activity has changed since you were last screened, and you are at an increased risk for chlamydia or gonorrhea. Ask your health care provider if you are at risk.  If you are at risk of being infected with HIV, it is recommended that you take a prescription medicine daily to prevent HIV infection. This is called pre-exposure prophylaxis (PrEP). You are considered at risk if:  You are a man who has sex with other men (MSM).  You are a heterosexual man who is sexually active with multiple partners.  You take drugs by injection.  You are sexually active with a partner who has HIV.  Talk with your health care provider about whether you are at high risk of being infected with HIV. If you choose to begin PrEP, you should first be tested for HIV. You should then be tested  every 3 months for as long as you are taking PrEP.  Use sunscreen. Apply sunscreen liberally and repeatedly throughout the day. You should seek shade when your shadow is shorter than you. Protect yourself by wearing long sleeves, pants, a wide-brimmed hat, and sunglasses year round whenever you are outdoors.  Tell your health care provider of new moles or changes in moles, especially if there is a change in shape or color. Also, tell your health care provider if a mole is larger than the size of a pencil eraser.  A one-time screening for abdominal aortic aneurysm (AAA) and surgical repair of large AAAs by ultrasound is recommended for men aged 65-75 years who are current or former smokers.  Stay current with your vaccines (immunizations).   This information is not intended to replace advice given to you by your health care provider. Make sure you discuss any questions you have with your health care provider.   Document Released: 04/26/2008 Document Revised: 11/19/2014 Document Reviewed: 03/26/2011 Elsevier Interactive Patient Education 2016 [employer]  

## 2016-01-30 NOTE — Progress Notes (Signed)
Subjective:    Patient ID: Douglas Ferguson, male    DOB: 06-18-1961, 55 y.o.   MRN: [id number]  Chief Complaint  Patient presents with  . CPE    not fasting    HPI:  Douglas Ferguson is a 55 y.o. male who presents today for an annual wellness visit.   1) Health Maintenance -   Diet - Averages about 3 meals per day consisting of just about everything. Caffeine intake of about 3-4 cups per day.  Exercise - 5x per week; 30-45 minutes per day   2) Preventative Exams / Immunizations:  Dental -- Up to date  Vision -- Due exam   Health Maintenance  Topic Date Due  . Hepatitis C Screening  06-18-1961  . HIV Screening  09/26/1975  . INFLUENZA VACCINE  07/27/2016 (Originally 06/13/2015)  . TETANUS/TDAP  01/22/2021  . COLONOSCOPY  02/27/2021    Immunization History  Administered Date(s) Administered  . Influenza Split 10/02/2011  . Influenza,inj,Quad PF,36+ Mos 09/29/2014  . Td 01/23/2011    No Known Allergies   No outpatient prescriptions prior to visit.   No facility-administered medications prior to visit.     Past Medical History  Diagnosis Date  . Shoulder pain, left     prn NSAIDS  . Varicose veins   . Migraines     PMH of     Past Surgical History  Procedure Laterality Date  . Rotator cuff repair      left  . Vasectomy    . Wisdom tooth extraction    . Colonoscopy  05/2011    negative; [facility]  . Esophagogastroduodenoscopy  12/19/2011    Procedure: ESOPHAGOGASTRODUODENOSCOPY (EGD);  Surgeon: Erick BlinksJay Pyrtle, MD;  [facility];  Service: Gastroenterology;  Laterality: N/A;  . Intramedullary (im) nail intertrochanteric Right 01/17/2013    Procedure: INTRAMEDULLARY (IM) NAIL INTERTROCHANTRIC;  Surgeon: Mable ParisJustin William Chandler, MD;  Location: [facility];  Service: Orthopedics;  Laterality: Right;     Family History  Problem Relation Age of Onset  . Lung cancer Father     smoker  . Hypertension Father   . Stroke Father [id number]  . Hypertension  Mother   . Dementia Mother   . Diabetes Neg Hx   . Heart disease Neg Hx   . Lung cancer Maternal Grandmother   . Stroke Paternal Grandmother   . Stroke Paternal Grandfather      Social History   Social History  . Marital Status: Married    Spouse Name: N/A  . Number of Children: 2  . Years of Education: 16   Occupational History  . Engineer    Social History Main Topics  . Smoking status: Former Smoker -- 0.50 packs/day for 2 years    Types: Cigarettes    Quit date: 04/16/1984  . Smokeless tobacco: Never Used     Comment: smoked 1982-1985, up to 1/2 ppd  . Alcohol Use: 0.0 oz/week    0 Standard drinks or equivalent per week     Comment:  very rarely  . Drug Use: No  . Sexual Activity: Not on file     Comment: not asked   Other Topics Concern  . Not on file   Social History Narrative   Fun: Walk, work outside around the house    Review of Systems  Constitutional: Denies fever, chills, fatigue, or significant weight gain/loss. HENT: Head: Denies headache or neck pain Ears: Denies changes in hearing, ringing in ears, earache, drainage Nose: Denies  discharge, stuffiness, itching, nosebleed, sinus pain Throat: Denies sore throat, hoarseness, dry mouth, sores, thrush Eyes: Denies loss/changes in vision, pain, redness, blurry/double vision, flashing lights Cardiovascular: Denies chest pain/discomfort, tightness, palpitations, shortness of breath with activity, difficulty lying down, swelling, sudden awakening with shortness of breath Respiratory: Denies shortness of breath, cough, sputum production, wheezing Gastrointestinal: Denies dysphasia, heartburn, change in appetite, nausea, change in bowel habits, rectal bleeding, constipation, diarrhea, yellow skin or eyes Genitourinary: Denies frequency, urgency, burning/pain, blood in urine, incontinence, change in urinary strength. Musculoskeletal: Denies muscle/joint pain, stiffness, back pain, redness or swelling of joints,  trauma Skin: Denies rashes, lumps, itching, dryness, color changes, or hair/nail changes Neurological: Denies dizziness, fainting, seizures, weakness, numbness, tingling, tremor Psychiatric - Denies nervousness, stress, depression or memory loss Endocrine: Denies heat or cold intolerance, sweating, frequent urination, excessive thirst, changes in appetite Hematologic: Denies ease of bruising or bleeding     Objective:     BP 126/62 mmHg  Pulse 56  Temp(Src) 98.3 F (36.8 C) (Oral)  Resp 14  Ht  (1.854 m)  Wt 254 lb 6.4 oz (115.395 kg)  BMI 33.57 kg/m2  SpO2 97% Nursing note and vital signs reviewed.  Physical Exam  Constitutional: He is oriented to person, place, and time. He appears well-developed and well-nourished.  HENT:  Head: Normocephalic.  Right Ear: Hearing, tympanic membrane, external ear and ear canal normal.  Left Ear: Hearing, tympanic membrane, external ear and ear canal normal.  Nose: Nose normal.  Mouth/Throat: Uvula is midline, oropharynx is clear and moist and mucous membranes are normal.  Eyes: Conjunctivae and EOM are normal. Pupils are equal, round, and reactive to light.  Neck: Neck supple. No JVD present. No tracheal deviation present. No thyromegaly present.  Cardiovascular: Normal rate, regular rhythm, normal heart sounds and intact distal pulses.   Pulmonary/Chest: Effort normal and breath sounds normal.  Abdominal: Soft. Bowel sounds are normal. He exhibits no distension and no mass. There is no tenderness. There is no rebound and no guarding.  Musculoskeletal: Normal range of motion. He exhibits no edema or tenderness.  Lymphadenopathy:    He has no cervical adenopathy.  Neurological: He is alert and oriented to person, place, and time. He has normal reflexes. No cranial nerve deficit. He exhibits normal muscle tone. Coordination normal.  Skin: Skin is warm and dry.  Psychiatric: He has a normal mood and affect. His behavior is normal. Judgment  and thought content normal.       Assessment & Plan:   Problem List Items Addressed This Visit      Other   Routine general medical examination at a health care facility - Primary    1) Anticipatory Guidance: Discussed importance of wearing a seatbelt while driving and not texting while driving; changing batteries in smoke detector at least once annually; wearing suntan lotion when outside; eating a balanced and moderate diet; getting physical activity at least 30 minutes per day.  2) Immunizations / Screenings / Labs:  All immunizations are up-to-date per recommendations. Obtain PSA for prostate cancer screening. Obtain hepatitis C antibody for hepatitis C screening. Due for a vision exam which is encouraged to be completed independently. All other screenings are up-to-date per recommendations. Obtain CBC, BMET, Lipid profile and TSH.    Overall well exam with risk factors for cardiovascular disease including obesity. Encouraged weight loss approximate 5-10% of current body weight through lifestyle management. Continue physical activity with goal of 30 minutes of moderate level activity daily. Encourage  nutritional intake that is balanced, varied, and moderate and focused on nutrient dense foods and low in saturated fats and processed/sugary foods. Continue other healthy lifestyle behaviors and choices. Follow-up prevention exam in 1 year. Follow-up office visit pending blood work as needed.      Relevant Orders   CBC   Comprehensive metabolic panel   Lipid panel   PSA   TSH   Hepatitis C antibody

## 2016-01-30 NOTE — Assessment & Plan Note (Signed)
1) Anticipatory Guidance: Discussed importance of wearing a seatbelt while driving and not texting while driving; changing batteries in smoke detector at least once annually; wearing suntan lotion when outside; eating a balanced and moderate diet; getting physical activity at least 30 minutes per day.  2) Immunizations / Screenings / Labs:  All immunizations are up-to-date per recommendations. Obtain PSA for prostate cancer screening. Obtain hepatitis C antibody for hepatitis C screening. Due for a vision exam which is encouraged to be completed independently. All other screenings are up-to-date per recommendations. Obtain CBC, BMET, Lipid profile and TSH.    Overall well exam with risk factors for cardiovascular disease including obesity. Encouraged weight loss approximate 5-10% of current body weight through lifestyle management. Continue physical activity with goal of 30 minutes of moderate level activity daily. Encourage nutritional intake that is balanced, varied, and moderate and focused on nutrient dense foods and low in saturated fats and processed/sugary foods. Continue other healthy lifestyle behaviors and choices. Follow-up prevention exam in 1 year. Follow-up office visit pending blood work as needed.

## 2016-01-30 NOTE — Progress Notes (Signed)
Pre visit review using our clinic review tool, if applicable. No additional management support is needed unless otherwise documented below in the visit note. 

## 2016-02-03 ENCOUNTER — Other Ambulatory Visit (INDEPENDENT_AMBULATORY_CARE_PROVIDER_SITE_OTHER): Payer: BLUE CROSS/BLUE SHIELD

## 2016-02-03 DIAGNOSIS — Z0000 Encounter for general adult medical examination without abnormal findings: Secondary | ICD-10-CM

## 2016-02-03 LAB — CBC
HCT: 45.5 % (ref 39.0–52.0)
Hemoglobin: 15.6 g/dL (ref 13.0–17.0)
MCHC: 34.3 g/dL (ref 30.0–36.0)
MCV: 87.8 fl (ref 78.0–100.0)
PLATELETS: 259 10*3/uL (ref 150.0–400.0)
RBC: 5.18 Mil/uL (ref 4.22–5.81)
RDW: 13.2 % (ref 11.5–15.5)
WBC: 6.8 10*3/uL (ref 4.0–10.5)

## 2016-02-03 LAB — LIPID PANEL
CHOLESTEROL: 127 mg/dL (ref 0–200)
HDL: 44.3 mg/dL (ref >39.00)
LDL Cholesterol: 70 mg/dL (ref 0–99)
NonHDL: 82.31
TRIGLYCERIDES: 63 mg/dL (ref 0.0–149.0)
Total CHOL/HDL Ratio: 3
VLDL: 12.6 mg/dL (ref 0.0–40.0)

## 2016-02-03 LAB — COMPREHENSIVE METABOLIC PANEL
ALBUMIN: 4.5 g/dL (ref 3.5–5.2)
ALK PHOS: 57 U/L (ref 39–117)
ALT: 19 U/L (ref 0–53)
AST: 18 U/L (ref 0–37)
BILIRUBIN TOTAL: 1 mg/dL (ref 0.2–1.2)
BUN: 18 mg/dL (ref 6–23)
CALCIUM: 9.8 mg/dL (ref 8.4–10.5)
CHLORIDE: 105 meq/L (ref 96–112)
CO2: 31 mEq/L (ref 19–32)
CREATININE: 1 mg/dL (ref 0.40–1.50)
GFR: 82.35 mL/min (ref >60.00)
Glucose, Bld: 103 mg/dL — ABNORMAL HIGH (ref 70–99)
Potassium: 5.1 mEq/L (ref 3.5–5.1)
Sodium: 141 mEq/L (ref 135–145)
TOTAL PROTEIN: 7 g/dL (ref 6.0–8.3)

## 2016-02-03 LAB — HEPATITIS C ANTIBODY: HCV AB: NEGATIVE

## 2016-02-03 LAB — TSH: TSH: 0.88 u[IU]/mL (ref 0.35–4.50)

## 2016-02-03 LAB — PSA: PSA: 0.38 ng/mL (ref 0.10–4.00)

## 2016-02-04 ENCOUNTER — Encounter: Payer: Self-pay | Admitting: Family

## 2016-02-16 DIAGNOSIS — I83813 Varicose veins of bilateral lower extremities with pain: Secondary | ICD-10-CM | POA: Diagnosis not present

## 2016-02-16 DIAGNOSIS — I83893 Varicose veins of bilateral lower extremities with other complications: Secondary | ICD-10-CM | POA: Diagnosis not present

## 2016-02-27 DIAGNOSIS — I83813 Varicose veins of bilateral lower extremities with pain: Secondary | ICD-10-CM | POA: Diagnosis not present

## 2016-02-27 DIAGNOSIS — I83893 Varicose veins of bilateral lower extremities with other complications: Secondary | ICD-10-CM | POA: Diagnosis not present

## 2016-03-20 DIAGNOSIS — I83813 Varicose veins of bilateral lower extremities with pain: Secondary | ICD-10-CM | POA: Diagnosis not present

## 2016-03-20 DIAGNOSIS — I83893 Varicose veins of bilateral lower extremities with other complications: Secondary | ICD-10-CM | POA: Diagnosis not present

## 2016-05-21 DIAGNOSIS — I83813 Varicose veins of bilateral lower extremities with pain: Secondary | ICD-10-CM | POA: Diagnosis not present

## 2016-05-21 DIAGNOSIS — I83893 Varicose veins of bilateral lower extremities with other complications: Secondary | ICD-10-CM | POA: Diagnosis not present

## 2016-12-11 ENCOUNTER — Encounter: Payer: Self-pay | Admitting: Family

## 2016-12-11 ENCOUNTER — Ambulatory Visit (INDEPENDENT_AMBULATORY_CARE_PROVIDER_SITE_OTHER): Payer: BLUE CROSS/BLUE SHIELD | Admitting: Family

## 2016-12-11 VITALS — BP 122/70 | HR 74 | Temp 98.1°F | Resp 16 | Ht 73.0 in | Wt 253.0 lb

## 2016-12-11 DIAGNOSIS — I8393 Asymptomatic varicose veins of bilateral lower extremities: Secondary | ICD-10-CM | POA: Diagnosis not present

## 2016-12-11 DIAGNOSIS — Z23 Encounter for immunization: Secondary | ICD-10-CM

## 2016-12-11 NOTE — Assessment & Plan Note (Signed)
Varicose veins of bilateral lower extremities that are refractory to compression socks with Vascular Surgery recommended surgical intervention. Ensure ABI complete with symptoms that are consistent with claudication. Recommend follow up with Vascular Surgery as veins are symptomatic to effect lifestyle.

## 2016-12-11 NOTE — Patient Instructions (Signed)
Thank you for choosing [facility].  SUMMARY AND INSTRUCTIONS:  Please check to see completion of ankle-brachial index.   Follow up with [facility].  Follow up:  If your symptoms worsen or fail to improve, please contact our office for further instruction, or in case of emergency go directly to the emergency room at the closest medical facility.     Varicose Veins Varicose veins are veins that have become enlarged and twisted. They are usually seen in the legs but can occur in other parts of the body as well. CAUSES This condition is the result of valves in the veins not working properly. Valves in the veins help to return blood from the leg to the heart. If these valves are damaged, blood flows backward and backs up into the veins in the leg near the skin. This causes the veins to become larger. RISK FACTORS People who are on their feet a lot, who are pregnant, or who are overweight are more likely to develop varicose veins. SIGNS AND SYMPTOMS  Bulging, twisted-appearing, bluish veins, most commonly found on the legs.  Leg pain or a feeling of heaviness. These symptoms may be worse at the end of the day.  Leg swelling.  Changes in skin color. DIAGNOSIS A health care provider can usually diagnose varicose veins by examining your legs. Your health care provider may also recommend an ultrasound of your leg veins. TREATMENT Most varicose veins can be treated at home.However, other treatments are available for people who have persistent symptoms or want to improve the cosmetic appearance of the varicose veins. These treatment options include:  Sclerotherapy. A solution is injected into the vein to close it off.  Laser treatment. A laser is used to heat the vein to close it off.  Radiofrequency vein ablation. An electrical current produced by radio waves is used to close off the vein.  Phlebectomy. The vein is surgically removed through small  incisions made over the varicose vein.  Vein ligation and stripping. The vein is surgically removed through incisions made over the varicose vein after the vein has been tied (ligated). HOME CARE INSTRUCTIONS   Do not stand or sit in one position for long periods of time. Do not sit with your legs crossed. Rest with your legs raised during the day.  Wear compression stockings as directed by your health care provider. These stockings help to prevent blood clots and reduce swelling in your legs.  Do not wear other tight, encircling garments around your legs, pelvis, or waist.  Walk as much as possible to increase blood flow.  Raise the foot of your bed at night with 2-inch blocks.  If you get a cut in the skin over the vein and the vein bleeds, lie down with your leg raised and press on it with a clean cloth until the bleeding stops. Then place a bandage (dressing) on the cut. See your health care provider if it continues to bleed. SEEK MEDICAL CARE IF:  The skin around your ankle starts to break down.  You have pain, redness, tenderness, or hard swelling in your leg over a vein.  You are uncomfortable because of leg pain. This information is not intended to replace advice given to you by your health care provider. Make sure you discuss any questions you have with your health care provider. Document Released: 08/08/2005 Document Revised: 02/20/2016 Document Reviewed: 03/16/2014 Elsevier Interactive Patient Education  2017 [employer] Inc.   Varicose Vein Surgery, Care After  Refer to this sheet in the next few weeks. These instructions provide you with information about caring for yourself after your procedure. Your health care provider may also give you more specific instructions. Your treatment has been planned according to current medical practices, but problems sometimes occur. Call your health care provider if you have any problems or questions after your procedure. What can I expect  after the procedure? After your procedure, it is typical to have the following:  Swelling.  Bruising.  Soreness.  Mild skin discoloration.  Slight bleeding at incision sites. Follow these instructions at home:  Take medicines only as directed by your health care provider.  Wear compression stockings as directed by your health care provider. These stockings help to prevent blood clots and reduce swelling in your legs.  There are many different ways to close and cover an incision, including stitches (sutures), skin glue, and adhesive strips. Follow your health care provider's instructions on:  Incision care.  Bandage (dressing) changes and removal.  Incision closure removal.  Wear loose-fitting clothing.  Get regular daily exercise. Walk or ride a stationary bike daily or as directed by your health care provider.  Ask your health care provider when you can return to work. This may depend on the type of work you do.  Be patient with your recovery. It can take up to 4 weeks to get back to your usual activities. Contact a health care provider if:  You have a fever.  You have drainage, redness, swelling, or pain at an incision site.  You develop a cough. Get help right away if:  You pass out.  You have very bad pain in your leg.  You have leg pain that gets worse when you walk.  You have redness or swelling in your leg that is getting worse.  You have trouble breathing.  You cough up blood. This information is not intended to replace advice given to you by your health care provider. Make sure you discuss any questions you have with your health care provider. Document Released: 07/02/2014 Document Revised: 04/05/2016 Document Reviewed: 04/07/2014 Elsevier Interactive Patient Education  2017 [employer].

## 2016-12-11 NOTE — Progress Notes (Signed)
Subjective:    Patient ID: Douglas Ferguson, male    DOB: 1961/01/18, 56 y.o.   MRN: [id number]  Chief Complaint  Patient presents with  . Leg issues    wanted to talk about varicose veins he has in both legs    HPI:  Douglas Ferguson is a 56 y.o. male who  has a past medical history of Migraines; Shoulder pain, left; and Varicose veins. and presents today for a follow up office visit.   Varicose veins - This is a new problem. Associated symptom of pain located in his distal lower extremities especially following activity and improves with rest that has been going on for a few years. Described as achy and only with activity. Daily activity does not cause symptoms but when walking on a regular basis for exercise symptoms do occur. No numbness or tingling or swelling. No changes in color of feet.  Has attempted compression socks in the past with minimal improvements.    No Known Allergies    No outpatient prescriptions prior to visit.   No facility-administered medications prior to visit.       Past Surgical History:  Procedure Laterality Date  . COLONOSCOPY  05/2011   negative; [facility]  . ESOPHAGOGASTRODUODENOSCOPY  12/19/2011   Procedure: ESOPHAGOGASTRODUODENOSCOPY (EGD);  Surgeon: Erick Blinks, MD;  Location: [facility];  Service: Gastroenterology;  Laterality: N/A;  . INTRAMEDULLARY (IM) NAIL INTERTROCHANTERIC Right 01/17/2013   Procedure: INTRAMEDULLARY (IM) NAIL INTERTROCHANTRIC;  Surgeon: Mable Paris, MD;  Location: [facility];  Service: Orthopedics;  Laterality: Right;  . ROTATOR CUFF REPAIR     left  . VASECTOMY    . WISDOM TOOTH EXTRACTION        Past Medical History:  Diagnosis Date  . Migraines    PMH of  . Shoulder pain, left    prn NSAIDS  . Varicose veins       Review of Systems  Constitutional: Negative for chills and fever.  Respiratory: Negative for cough, chest tightness and wheezing.   Cardiovascular: Negative for chest pain,  palpitations and leg swelling.       Positive for varicose veins.       Objective:    BP 122/70 (BP Location: Left Arm, Patient Position: Sitting, Cuff Size: Large)   Pulse 74   Temp 98.1 F (36.7 C) (Oral)   Resp 16   Ht 6\' 1"  (1.854 m)   Wt 253 lb (114.8 kg)   SpO2 98%   BMI 33.38 kg/m  Nursing note and vital signs reviewed.  Physical Exam  Constitutional: He is oriented to person, place, and time. He appears well-developed and well-nourished. No distress.  Cardiovascular: Normal rate, regular rhythm, normal heart sounds and intact distal pulses.   Bilateral varicose/tortured veins noted on bilateral legs that are compressible and non-tender.   Pulmonary/Chest: Effort normal and breath sounds normal.  Neurological: He is alert and oriented to person, place, and time.  Skin: Skin is warm and dry.  Psychiatric: He has a normal mood and affect. His behavior is normal. Judgment and thought content normal.       Assessment & Plan:   Problem List Items Addressed This Visit      Cardiovascular and Mediastinum   Varicose veins of both lower extremities - Primary    Varicose veins of bilateral lower extremities that are refractory to compression socks with Vascular Surgery recommended surgical intervention. Ensure ABI complete with symptoms that are consistent with claudication. Recommend follow up  with Vascular Surgery as veins are symptomatic to effect lifestyle.       Other Visit Diagnoses    Encounter for immunization       Relevant Orders   Flu Vaccine QUAD 36+ mos IM (Completed)       I am having Mr. Rae RoamWatterson maintain his Turmeric and vitamin E.   Meds ordered this encounter  Medications  . Turmeric 500 MG CAPS    Sig: Take by mouth.  . vitamin E 400 UNIT capsule    Sig: Take 500 Units by mouth daily.     Follow-up: Return if symptoms worsen or fail to improve.  Jeanine Luzalone, Gregory, FNP

## 2017-01-21 ENCOUNTER — Telehealth: Payer: Self-pay | Admitting: Family

## 2017-01-21 NOTE — Telephone Encounter (Signed)
Notes have been faxed

## 2017-01-21 NOTE — Telephone Encounter (Signed)
Judeth CornfieldStephanie from [facility] called asking for [facility]. They require office notes from another provider when requesting pre-certification for varicose vein treatment. These notes can be faxed to [facility] at [phone]. Please advise. Thanks [employer] [facility]

## 2017-01-29 DIAGNOSIS — I83893 Varicose veins of bilateral lower extremities with other complications: Secondary | ICD-10-CM | POA: Diagnosis not present

## 2017-01-29 DIAGNOSIS — I8311 Varicose veins of right lower extremity with inflammation: Secondary | ICD-10-CM | POA: Diagnosis not present

## 2017-01-29 DIAGNOSIS — I8312 Varicose veins of left lower extremity with inflammation: Secondary | ICD-10-CM | POA: Diagnosis not present

## 2017-03-13 DIAGNOSIS — I83891 Varicose veins of right lower extremities with other complications: Secondary | ICD-10-CM | POA: Diagnosis not present

## 2017-03-13 DIAGNOSIS — I8311 Varicose veins of right lower extremity with inflammation: Secondary | ICD-10-CM | POA: Diagnosis not present

## 2017-03-15 DIAGNOSIS — I8311 Varicose veins of right lower extremity with inflammation: Secondary | ICD-10-CM | POA: Diagnosis not present

## 2017-04-10 DIAGNOSIS — I8311 Varicose veins of right lower extremity with inflammation: Secondary | ICD-10-CM | POA: Diagnosis not present

## 2017-04-24 DIAGNOSIS — I8311 Varicose veins of right lower extremity with inflammation: Secondary | ICD-10-CM | POA: Diagnosis not present

## 2017-04-24 DIAGNOSIS — I83811 Varicose veins of right lower extremities with pain: Secondary | ICD-10-CM | POA: Diagnosis not present

## 2017-05-08 DIAGNOSIS — I83891 Varicose veins of right lower extremities with other complications: Secondary | ICD-10-CM | POA: Diagnosis not present

## 2017-05-08 DIAGNOSIS — I8311 Varicose veins of right lower extremity with inflammation: Secondary | ICD-10-CM | POA: Diagnosis not present

## 2017-05-29 DIAGNOSIS — I8312 Varicose veins of left lower extremity with inflammation: Secondary | ICD-10-CM | POA: Diagnosis not present

## 2017-05-31 DIAGNOSIS — I8312 Varicose veins of left lower extremity with inflammation: Secondary | ICD-10-CM | POA: Diagnosis not present

## 2017-06-05 DIAGNOSIS — I8312 Varicose veins of left lower extremity with inflammation: Secondary | ICD-10-CM | POA: Diagnosis not present

## 2017-06-05 DIAGNOSIS — I83892 Varicose veins of left lower extremities with other complications: Secondary | ICD-10-CM | POA: Diagnosis not present

## 2017-07-08 DIAGNOSIS — I83892 Varicose veins of left lower extremities with other complications: Secondary | ICD-10-CM | POA: Diagnosis not present

## 2017-07-08 DIAGNOSIS — I8312 Varicose veins of left lower extremity with inflammation: Secondary | ICD-10-CM | POA: Diagnosis not present

## 2017-08-13 DIAGNOSIS — I8312 Varicose veins of left lower extremity with inflammation: Secondary | ICD-10-CM | POA: Diagnosis not present

## 2017-08-13 DIAGNOSIS — I83892 Varicose veins of left lower extremities with other complications: Secondary | ICD-10-CM | POA: Diagnosis not present

## 2018-03-04 DIAGNOSIS — H52203 Unspecified astigmatism, bilateral: Secondary | ICD-10-CM | POA: Diagnosis not present

## 2018-08-05 DIAGNOSIS — I83813 Varicose veins of bilateral lower extremities with pain: Secondary | ICD-10-CM | POA: Diagnosis not present

## 2018-08-05 DIAGNOSIS — I83893 Varicose veins of bilateral lower extremities with other complications: Secondary | ICD-10-CM | POA: Diagnosis not present

## 2018-08-28 DIAGNOSIS — H00015 Hordeolum externum left lower eyelid: Secondary | ICD-10-CM | POA: Diagnosis not present

## 2019-08-21 DIAGNOSIS — E669 Obesity, unspecified: Secondary | ICD-10-CM | POA: Diagnosis not present

## 2019-08-21 DIAGNOSIS — Z1331 Encounter for screening for depression: Secondary | ICD-10-CM | POA: Diagnosis not present

## 2019-08-21 DIAGNOSIS — Z23 Encounter for immunization: Secondary | ICD-10-CM | POA: Diagnosis not present

## 2019-08-21 DIAGNOSIS — Z0000 Encounter for general adult medical examination without abnormal findings: Secondary | ICD-10-CM | POA: Diagnosis not present

## 2019-08-21 DIAGNOSIS — Z125 Encounter for screening for malignant neoplasm of prostate: Secondary | ICD-10-CM | POA: Diagnosis not present

## 2019-12-04 DIAGNOSIS — K200 Eosinophilic esophagitis: Secondary | ICD-10-CM | POA: Diagnosis not present

## 2019-12-04 DIAGNOSIS — D721 Eosinophilia, unspecified: Secondary | ICD-10-CM | POA: Diagnosis not present

## 2019-12-04 DIAGNOSIS — E669 Obesity, unspecified: Secondary | ICD-10-CM | POA: Diagnosis not present

## 2019-12-04 DIAGNOSIS — Z87898 Personal history of other specified conditions: Secondary | ICD-10-CM | POA: Diagnosis not present

## 2019-12-04 DIAGNOSIS — Z1389 Encounter for screening for other disorder: Secondary | ICD-10-CM | POA: Diagnosis not present

## 2020-02-24 DIAGNOSIS — Z23 Encounter for immunization: Secondary | ICD-10-CM | POA: Diagnosis not present

## 2020-03-23 DIAGNOSIS — Z23 Encounter for immunization: Secondary | ICD-10-CM | POA: Diagnosis not present

## 2020-04-26 DIAGNOSIS — N486 Induration penis plastica: Secondary | ICD-10-CM | POA: Diagnosis not present

## 2021-01-31 ENCOUNTER — Encounter: Payer: Self-pay | Admitting: Internal Medicine

## 2021-03-15 ENCOUNTER — Other Ambulatory Visit: Payer: Self-pay

## 2021-03-15 ENCOUNTER — Encounter: Payer: Self-pay | Admitting: Internal Medicine

## 2021-03-15 ENCOUNTER — Ambulatory Visit (INDEPENDENT_AMBULATORY_CARE_PROVIDER_SITE_OTHER): Payer: No Typology Code available for payment source | Admitting: Internal Medicine

## 2021-03-15 VITALS — BP 126/74 | HR 56 | Ht 72.0 in | Wt 246.2 lb

## 2021-03-15 DIAGNOSIS — K5730 Diverticulosis of large intestine without perforation or abscess without bleeding: Secondary | ICD-10-CM | POA: Diagnosis not present

## 2021-03-15 DIAGNOSIS — K200 Eosinophilic esophagitis: Secondary | ICD-10-CM

## 2021-03-15 DIAGNOSIS — Z1211 Encounter for screening for malignant neoplasm of colon: Secondary | ICD-10-CM | POA: Diagnosis not present

## 2021-03-15 DIAGNOSIS — R1310 Dysphagia, unspecified: Secondary | ICD-10-CM

## 2021-03-15 MED ORDER — PLENVU 140 G PO SOLR: 1.0000 | Freq: Once | ORAL | 0 refills | Status: AC

## 2021-03-15 NOTE — Patient Instructions (Signed)
If you are age 61 or older, your body mass index should be between 23-30. Your Body mass index is 33.39 kg/m. If this is out of the aforementioned range listed, please consider follow up with your Primary Care Provider.  If you are age 66 or younger, your body mass index should be between 19-25. Your Body mass index is 33.39 kg/m. If this is out of the aformentioned range listed, please consider follow up with your Primary Care Provider.   You have been scheduled for an endoscopy and colonoscopy. Please follow the written instructions given to you at your visit today. Please pick up your prep supplies at the pharmacy within the next 1-3 days. If you use inhalers (even only as needed), please bring them with you on the day of your procedure.

## 2021-03-15 NOTE — Progress Notes (Signed)
HISTORY OF PRESENT ILLNESS:  Douglas Ferguson is a 60 y.o. 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  The patient underwent index screening colonoscopy April 2012.  He was found to have moderate sigmoid diverticulosis.  The examination was otherwise normal.  Follow-up in 10 years recommended.  Patient was subsequently seen by my colleague Dr. Rhea Belton on an emergency basis when he presented to the hospital with an acute food impaction.  This was relieved endoscopically.  He had no changes that suggested eosinophilic esophagitis.  Biopsies were supportive of the same.  I did see him in the office in follow-up thereafter.  The plan was to schedule endoscopy, patient did not follow through.  He tells me that he is careful he is.  He has had some intermittent solid food dysphagia over the years, but nothing severe.  He is on no regular medications.  His GI review of systems is entirely negative.  No family history of colon cancer.  No relevant laboratories or x-rays to report.  REVIEW OF SYSTEMS:  All non-GI ROS negative unless otherwise stated in the HPI except for headaches  Past Medical History:  Diagnosis Date  . Migraines    PMH of  . Shoulder pain, left    prn NSAIDS  . Varicose veins     Past Surgical History:  Procedure Laterality Date  . COLONOSCOPY  05/2011   negative;  [facility]  . ESOPHAGOGASTRODUODENOSCOPY  12/19/2011   Procedure: ESOPHAGOGASTRODUODENOSCOPY (EGD);  Surgeon: Erick Blinks, MD;  Location: [facility];  Service: Gastroenterology;  Laterality: N/A;  . INTRAMEDULLARY (IM) NAIL INTERTROCHANTERIC Right 01/17/2013   Procedure: INTRAMEDULLARY (IM) NAIL INTERTROCHANTRIC;  Surgeon: Mable Paris, MD;  Location: [facility];  Service: Orthopedics;  Laterality: Right;  . ROTATOR CUFF REPAIR     left  . VASECTOMY    . WISDOM TOOTH EXTRACTION      Social  History Terrance Usery  reports that he quit smoking about 36 years ago. His smoking use included cigarettes. He has a 1.00 pack-year smoking history. He has never used smokeless tobacco. He reports current alcohol use. He reports that he does not use drugs.  family history includes Dementia in his mother; Hypertension in his father and mother; Lung cancer in his father and maternal grandmother; Stroke in his paternal grandfather and paternal grandmother; Stroke (age of onset: 57) in his father.  No Known Allergies     PHYSICAL EXAMINATION: Vital signs: BP 126/74   Pulse (!) 56   Ht 6' (1.829 m)   Wt 246 lb 3.2 oz (111.7 kg)   BMI 33.39 kg/m   Constitutional: generally well-appearing, no acute distress Psychiatric: alert and oriented x3, cooperative Eyes: extraocular movements intact, anicteric, conjunctiva pink Mouth: oral pharynx moist, no lesions Neck: supple no lymphadenopathy Cardiovascular: heart regular rate and rhythm, no murmur Lungs: clear to auscultation bilaterally Abdomen: soft, nontender, nondistended, no obvious ascites, no peritoneal signs, normal bowel sounds, no organomegaly Rectal: Deferred until colonoscopy Extremities: no clubbing, cyanosis, or lower extremity edema bilaterally Skin: no lesions on visible extremities Neuro: No focal deficits.  Cranial nerves intact  ASSESSMENT:  1.  Screening colonoscopy.  Appropriate candidate without contraindication.  Index examination 2012 with sigmoid diverticulosis 2.  History of esophageal food impaction status post endoscopic removal 2013.  He continues with intermittent solid food dysphagia 3.  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  Schedule screening colonoscopy.The nature of the procedure, as well as the risks, benefits, and alternatives were carefully and thoroughly reviewed with the patient.  Ample time for discussion and questions allowed. The patient understood, was satisfied, and agreed to proceed. 2.  Schedule upper endoscopy with possible esophageal dilation.The nature of the procedure, as well as the risks, benefits, and alternatives were carefully and thoroughly reviewed with the patient. Ample time for discussion and questions allowed. The patient understood, was satisfied, and agreed to proceed. 3.  Consider repeat biopsies and trial of high-dose PPI.

## 2021-05-30 ENCOUNTER — Telehealth: Payer: Self-pay | Admitting: Internal Medicine

## 2021-05-30 NOTE — Telephone Encounter (Signed)
Inbound call from patient requesting prep script be sent to [facility] on [address] in [place] please.

## 2021-05-31 MED ORDER — PLENVU 140 G PO SOLR: 1.0000 | Freq: Once | ORAL | 0 refills | Status: AC

## 2021-05-31 NOTE — Telephone Encounter (Signed)
Plenvu sent to [facility]

## 2021-06-08 ENCOUNTER — Ambulatory Visit (AMBULATORY_SURGERY_CENTER): Payer: No Typology Code available for payment source | Admitting: Internal Medicine

## 2021-06-08 ENCOUNTER — Encounter: Payer: Self-pay | Admitting: Internal Medicine

## 2021-06-08 ENCOUNTER — Other Ambulatory Visit: Payer: Self-pay

## 2021-06-08 VITALS — BP 110/51 | HR 58 | Temp 97.5°F | Resp 14 | Ht 72.0 in | Wt 246.0 lb

## 2021-06-08 DIAGNOSIS — Z1211 Encounter for screening for malignant neoplasm of colon: Secondary | ICD-10-CM | POA: Diagnosis not present

## 2021-06-08 DIAGNOSIS — K222 Esophageal obstruction: Secondary | ICD-10-CM

## 2021-06-08 DIAGNOSIS — K200 Eosinophilic esophagitis: Secondary | ICD-10-CM | POA: Diagnosis not present

## 2021-06-08 DIAGNOSIS — R1310 Dysphagia, unspecified: Secondary | ICD-10-CM

## 2021-06-08 DIAGNOSIS — K5730 Diverticulosis of large intestine without perforation or abscess without bleeding: Secondary | ICD-10-CM

## 2021-06-08 DIAGNOSIS — K2289 Other specified disease of esophagus: Secondary | ICD-10-CM | POA: Diagnosis not present

## 2021-06-08 MED ORDER — SODIUM CHLORIDE 0.9 % IV SOLN: 500.0000 mL | Freq: Once | INTRAVENOUS | Status: DC

## 2021-06-08 MED ORDER — PANTOPRAZOLE SODIUM 40 MG PO TBEC
40.0000 mg | DELAYED_RELEASE_TABLET | Freq: Every day | ORAL | 11 refills | Status: DC
Start: 2021-06-08 — End: 2022-09-28

## 2021-06-08 NOTE — Patient Instructions (Signed)
Impression/Recommendations:  GERD, esophagitis, post-dilation diet, and diverticulosis handouts given to patient.  Pantoprazole 40 mg daily.  Take one each morning 30-60 minutes before breakfast.  Await pathology results.  Follow-up in the office with Dr. [name] in 6-8 weeks.  YOU HAD AN ENDOSCOPIC PROCEDURE TODAY AT THE [facility]:   Refer to the procedure report that was given to you for any specific questions about what was found during the examination.  If the procedure report does not answer your questions, please call your gastroenterologist to clarify.  If you requested that your care partner not be given the details of your procedure findings, then the procedure report has been included in a sealed envelope for you to review at your convenience later.  YOU SHOULD EXPECT: Some feelings of bloating in the abdomen. Passage of more gas than usual.  Walking can help get rid of the air that was put into your GI tract during the procedure and reduce the bloating. If you had a lower endoscopy (such as a colonoscopy or flexible sigmoidoscopy) you may notice spotting of blood in your stool or on the toilet paper. If you underwent a bowel prep for your procedure, you may not have a normal bowel movement for a few days.  Please Note:  You might notice some irritation and congestion in your nose or some drainage.  This is from the oxygen used during your procedure.  There is no need for concern and it should clear up in a day or so.  SYMPTOMS TO REPORT IMMEDIATELY:  Following lower endoscopy (colonoscopy or flexible sigmoidoscopy):  Excessive amounts of blood in the stool  Significant tenderness or worsening of abdominal pains  Swelling of the abdomen that is new, acute  Fever of 100F or higher  Following upper endoscopy (EGD)  Vomiting of blood or coffee ground material  New chest pain or pain under the shoulder blades  Painful or persistently difficult swallowing  New shortness  of breath  Fever of 100F or higher  Black, tarry-looking stools  For urgent or emergent issues, a gastroenterologist can be reached at any hour by calling [phone]. Do not use MyChart messaging for urgent concerns.    DIET:  We do recommend a small meal at first, but then you may proceed to your regular diet.  Drink plenty of fluids but you should avoid alcoholic beverages for 24 hours.  ACTIVITY:  You should plan to take it easy for the rest of today and you should NOT DRIVE or use heavy machinery until tomorrow (because of the sedation medicines used during the test).    FOLLOW UP: Our staff will call the number listed on your records 48-72 hours following your procedure to check on you and address any questions or concerns that you may have regarding the information given to you following your procedure. If we do not reach you, we will leave a message.  We will attempt to reach you two times.  During this call, we will ask if you have developed any symptoms of COVID 19. If you develop any symptoms (ie: fever, flu-like symptoms, shortness of breath, cough etc.) before then, please call [phone].  If you test positive for Covid 19 in the 2 weeks post procedure, please call and report this information to [place].    If any biopsies were taken you will be contacted by phone or by letter within the next 1-3 weeks.  Please call us at [phone] if you have not heard  about the biopsies in 3 weeks.    SIGNATURES/CONFIDENTIALITY: You and/or your care partner have signed paperwork which will be entered into your electronic medical record.  These signatures attest to the fact that that the information above on your After Visit Summary has been reviewed and is understood.  Full responsibility of the confidentiality of this discharge information lies with you and/or your care-partner.

## 2021-06-08 NOTE — Progress Notes (Signed)
Medical history reviewed with no changes noted. VS assessed by C.W 

## 2021-06-08 NOTE — Progress Notes (Signed)
Report to PACU, RN, vss, BBS= Clear.  

## 2021-06-08 NOTE — Progress Notes (Signed)
Called to room to assist during endoscopic procedure.  Patient ID and intended procedure confirmed with present staff. Received instructions for my participation in the procedure from the performing physician.  

## 2021-06-08 NOTE — Op Note (Signed)
[facility] Patient Name: Douglas Ferguson Procedure Date: 06/08/2021 2:15 PM MRN: [id number] Endoscopist: Wilhemina Bonito. [name] , MD Age: 60 Referring MD:  Date of Birth: [birth year] Gender: Male Account #: [id number] Procedure:                Upper GI endoscopy with biopsy; with balloon                            dilation of the esophagus?"18 mm Indications:              Dysphagia. History of esophageal food impaction                            with EOE esophagus 2013. Not on PPI. No follow-up                            since Medicines:                Monitored Anesthesia Care Procedure:                Pre-Anesthesia Assessment:                           - Prior to the procedure, a History and Physical                            was performed, and patient medications and                            allergies were reviewed. The patient's tolerance of                            previous anesthesia was also reviewed. The risks                            and benefits of the procedure and the sedation                            options and risks were discussed with the patient.                            All questions were answered, and informed consent                            was obtained. Prior Anticoagulants: The patient has                            taken no previous anticoagulant or antiplatelet                            agents. ASA Grade Assessment: II - A patient with                            mild systemic disease. After reviewing the risks  and benefits, the patient was deemed in                            satisfactory condition to undergo the procedure.                           After obtaining informed consent, the endoscope was                            passed under direct vision. Throughout the                            procedure, the patient's blood pressure, pulse, and                            oxygen saturations were monitored  continuously. The                            GIF [id number] #[id number] was introduced through the                            mouth, and advanced to the second part of duodenum.                            The upper GI endoscopy was accomplished without                            difficulty. The patient tolerated the procedure                            well. Scope In: Scope Out: Findings:                 One benign-appearing, intrinsic moderate stenosis                            was found 40 cm from the incisors. This stenosis                            measured 1.4 cm (inner diameter). The stenosis was                            traversed. A TTS dilator was passed through the                            scope. Dilation with a 15-16.5-18 mm balloon                            dilator was performed to 18 mm.                           The esophagus had evidence of esophagitis at the                            area  of the stricture. No Barrett's. There were                            rings and furrows proximal to this consistent with                            EOE type phenotype esophagus. Biopsies were taken                            with a cold forceps for histology.                           The stomach was normal, save small hiatal hernia.                           The examined duodenum was normal.                           The cardia and gastric fundus were normal on                            retroflexion. Complications:            No immediate complications. Estimated Blood Loss:     Estimated blood loss: none. Impression:               1. GERD with esophagitis and distal esophageal                            stricture status post dilation                           2. EOE type esophagus (primary or secondary).                            Status post biopsies                           3. Otherwise unremarkable EGD. Recommendation:           1. Patient has a contact number available for                             emergencies. The signs and symptoms of potential                            delayed complications were discussed with the                            patient. Return to normal activities tomorrow.                            Written discharge instructions were provided to the                            patient.  2. Post dilation diet.                           3. Prescribe pantoprazole 40 mg daily; #30; 11                            refills. Take 1 each morning 30 to 60 minutes                            before breakfast.                           4. Await pathology results.                           5. Office follow-up with Dr. [name] in 6 to 8 weeks Wilhemina Bonito. [name], MD 06/08/2021 3:04:40 PM This report has been signed electronically.

## 2021-06-08 NOTE — Op Note (Signed)
[facility] Patient Name: Douglas Ferguson Procedure Date: 06/08/2021 2:15 PM MRN: [id number] Endoscopist: Wilhemina Bonito. Douglas Ferguson , MD Age: 60 Referring MD:  Date of Birth: 1960/11/24 Gender: Male Account #: [id number] Procedure:                Colonoscopy Indications:              Screening for colorectal malignant neoplasm.                            Negative index exam 2012 Medicines:                Monitored Anesthesia Care Procedure:                Pre-Anesthesia Assessment:                           - Prior to the procedure, a History and Physical                            was performed, and patient medications and                            allergies were reviewed. The patient's tolerance of                            previous anesthesia was also reviewed. The risks                            and benefits of the procedure and the sedation                            options and risks were discussed with the patient.                            All questions were answered, and informed consent                            was obtained. Prior Anticoagulants: The patient has                            taken no previous anticoagulant or antiplatelet                            agents. ASA Grade Assessment: II - A patient with                            mild systemic disease. After reviewing the risks                            and benefits, the patient was deemed in                            satisfactory condition to undergo the procedure.  After obtaining informed consent, the colonoscope                            was passed under direct vision. Throughout the                            procedure, the patient's blood pressure, pulse, and                            oxygen saturations were monitored continuously. The                            CF [id number] #[id number] was introduced through the anus                            and advanced to the the cecum,  identified by                            appendiceal orifice and ileocecal valve. The                            ileocecal valve, appendiceal orifice, and rectum                            were photographed. The quality of the bowel                            preparation was excellent. The colonoscopy was                            performed without difficulty. The patient tolerated                            the procedure well. The bowel preparation used was                            SUPREP via split dose instruction. Scope In: 2:29:48 PM Scope Out: 2:42:47 PM Scope Withdrawal Time: 0 hours 11 minutes 2 seconds  Total Procedure Duration: 0 hours 12 minutes 59 seconds  Findings:                 Multiple diverticula were found in the left colon.                           The exam was otherwise without abnormality on                            direct and retroflexion views. Complications:            No immediate complications. Estimated blood loss:                            None. Estimated Blood Loss:     Estimated blood loss: none. Impression:               -  Diverticulosis in the left colon.                           - The examination was otherwise normal on direct                            and retroflexion views.                           - No specimens collected. Recommendation:           - Repeat colonoscopy in 10 years for screening                            purposes.                           - Patient has a contact number available for                            emergencies. The signs and symptoms of potential                            delayed complications were discussed with the                            patient. Return to normal activities tomorrow.                            Written discharge instructions were provided to the                            patient.                           - Resume previous diet.                           - Continue present  medications. Wilhemina Bonito. Douglas Goodell, MD 06/08/2021 2:45:48 PM This report has been signed electronically.

## 2021-06-12 ENCOUNTER — Telehealth: Payer: Self-pay | Admitting: *Deleted

## 2021-06-12 NOTE — Telephone Encounter (Signed)
Left message on f/u call 

## 2021-06-12 NOTE — Telephone Encounter (Signed)
  Follow up Call-  Call back number 06/08/2021  Post procedure Call Back phone  # [phone]  Permission to leave phone message Yes  Some recent data might be hidden     Patient questions:  Do you have a fever, pain , or abdominal swelling? No. Pain Score  0 *  Have you tolerated food without any problems? Yes.    Have you been able to return to your normal activities? Yes.    Do you have any questions about your discharge instructions: Diet   No. Medications  No. Follow up visit  No.  Do you have questions or concerns about your Care? No.  Actions: * If pain score is 4 or above: No action needed, pain <4.  Have you developed a fever since your procedure? no  2.   Have you had an respiratory symptoms (SOB or cough) since your procedure? no  3.   Have you tested positive for COVID 19 since your procedure no  4.   Have you had any family members/close contacts diagnosed with the COVID 19 since your procedure?  no   If yes to any of these questions please route to Laverna Peace, RN and Karlton Lemon, RN

## 2021-06-13 ENCOUNTER — Encounter: Payer: Self-pay | Admitting: Internal Medicine

## 2022-09-28 ENCOUNTER — Other Ambulatory Visit: Payer: Self-pay | Admitting: Internal Medicine

## 2023-01-01 DIAGNOSIS — Z125 Encounter for screening for malignant neoplasm of prostate: Secondary | ICD-10-CM | POA: Diagnosis not present

## 2023-01-01 DIAGNOSIS — E669 Obesity, unspecified: Secondary | ICD-10-CM | POA: Diagnosis not present

## 2023-01-01 DIAGNOSIS — R7301 Impaired fasting glucose: Secondary | ICD-10-CM | POA: Diagnosis not present

## 2023-01-08 DIAGNOSIS — E785 Hyperlipidemia, unspecified: Secondary | ICD-10-CM | POA: Diagnosis not present

## 2023-01-08 DIAGNOSIS — R972 Elevated prostate specific antigen [PSA]: Secondary | ICD-10-CM | POA: Diagnosis not present

## 2023-01-08 DIAGNOSIS — Z1339 Encounter for screening examination for other mental health and behavioral disorders: Secondary | ICD-10-CM | POA: Diagnosis not present

## 2023-01-08 DIAGNOSIS — L989 Disorder of the skin and subcutaneous tissue, unspecified: Secondary | ICD-10-CM | POA: Diagnosis not present

## 2023-01-08 DIAGNOSIS — K200 Eosinophilic esophagitis: Secondary | ICD-10-CM | POA: Diagnosis not present

## 2023-01-08 DIAGNOSIS — Z23 Encounter for immunization: Secondary | ICD-10-CM | POA: Diagnosis not present

## 2023-01-08 DIAGNOSIS — Z0000 Encounter for general adult medical examination without abnormal findings: Secondary | ICD-10-CM | POA: Diagnosis not present

## 2023-01-08 DIAGNOSIS — Z1331 Encounter for screening for depression: Secondary | ICD-10-CM | POA: Diagnosis not present

## 2023-03-18 DIAGNOSIS — D2262 Melanocytic nevi of left upper limb, including shoulder: Secondary | ICD-10-CM | POA: Diagnosis not present

## 2023-03-18 DIAGNOSIS — D2361 Other benign neoplasm of skin of right upper limb, including shoulder: Secondary | ICD-10-CM | POA: Diagnosis not present

## 2023-03-18 DIAGNOSIS — L570 Actinic keratosis: Secondary | ICD-10-CM | POA: Diagnosis not present

## 2023-03-18 DIAGNOSIS — L821 Other seborrheic keratosis: Secondary | ICD-10-CM | POA: Diagnosis not present

## 2023-06-27 DIAGNOSIS — R972 Elevated prostate specific antigen [PSA]: Secondary | ICD-10-CM | POA: Diagnosis not present

## 2023-06-27 DIAGNOSIS — N401 Enlarged prostate with lower urinary tract symptoms: Secondary | ICD-10-CM | POA: Diagnosis not present

## 2023-06-27 DIAGNOSIS — K200 Eosinophilic esophagitis: Secondary | ICD-10-CM | POA: Diagnosis not present

## 2024-01-07 DIAGNOSIS — Z1389 Encounter for screening for other disorder: Secondary | ICD-10-CM | POA: Diagnosis not present

## 2024-01-07 DIAGNOSIS — Z125 Encounter for screening for malignant neoplasm of prostate: Secondary | ICD-10-CM | POA: Diagnosis not present

## 2024-01-07 DIAGNOSIS — E785 Hyperlipidemia, unspecified: Secondary | ICD-10-CM | POA: Diagnosis not present

## 2024-01-07 DIAGNOSIS — R7301 Impaired fasting glucose: Secondary | ICD-10-CM | POA: Diagnosis not present

## 2024-01-14 DIAGNOSIS — Z1331 Encounter for screening for depression: Secondary | ICD-10-CM | POA: Diagnosis not present

## 2024-01-14 DIAGNOSIS — Z1389 Encounter for screening for other disorder: Secondary | ICD-10-CM | POA: Diagnosis not present

## 2024-01-14 DIAGNOSIS — Z0000 Encounter for general adult medical examination without abnormal findings: Secondary | ICD-10-CM | POA: Diagnosis not present

## 2024-02-13 DIAGNOSIS — H52203 Unspecified astigmatism, bilateral: Secondary | ICD-10-CM | POA: Diagnosis not present

## 2024-02-13 DIAGNOSIS — H2513 Age-related nuclear cataract, bilateral: Secondary | ICD-10-CM | POA: Diagnosis not present

## 2024-03-23 DIAGNOSIS — D2262 Melanocytic nevi of left upper limb, including shoulder: Secondary | ICD-10-CM | POA: Diagnosis not present

## 2024-03-23 DIAGNOSIS — D225 Melanocytic nevi of trunk: Secondary | ICD-10-CM | POA: Diagnosis not present

## 2024-03-23 DIAGNOSIS — L720 Epidermal cyst: Secondary | ICD-10-CM | POA: Diagnosis not present

## 2024-03-23 DIAGNOSIS — L814 Other melanin hyperpigmentation: Secondary | ICD-10-CM | POA: Diagnosis not present

## 2024-03-23 DIAGNOSIS — D2261 Melanocytic nevi of right upper limb, including shoulder: Secondary | ICD-10-CM | POA: Diagnosis not present
